# Patient Record
Sex: Male | Born: 1990 | Race: Black or African American | Hispanic: No | Marital: Single | Smoking: Current every day smoker
Health system: Southern US, Community
[De-identification: ages and names within clinical notes are randomized; demographics above are authoritative.]

## PROBLEM LIST (undated history)

## (undated) DIAGNOSIS — J189 Pneumonia, unspecified organism: Secondary | ICD-10-CM

## (undated) HISTORY — PX: NO PAST SURGERIES: SHX2092

---
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
## 2009-06-18 ENCOUNTER — Emergency Department (HOSPITAL_COMMUNITY): Admission: EM | Admit: 2009-06-18 | Discharge: 2009-06-18 | Payer: Self-pay | Admitting: Emergency Medicine

## 2011-06-13 ENCOUNTER — Emergency Department (HOSPITAL_COMMUNITY): Payer: Self-pay

## 2011-06-13 ENCOUNTER — Encounter: Payer: Self-pay | Admitting: Emergency Medicine

## 2011-06-13 ENCOUNTER — Emergency Department (HOSPITAL_COMMUNITY)
Admission: EM | Admit: 2011-06-13 | Discharge: 2011-06-13 | Disposition: A | Discharge status: Home / Self Care | Payer: Self-pay | Attending: Emergency Medicine | Admitting: Emergency Medicine

## 2011-06-13 DIAGNOSIS — M25569 Pain in unspecified knee: Secondary | ICD-10-CM | POA: Insufficient documentation

## 2011-06-13 DIAGNOSIS — W219XXA Striking against or struck by unspecified sports equipment, initial encounter: Secondary | ICD-10-CM | POA: Insufficient documentation

## 2011-06-13 DIAGNOSIS — Y9361 Activity, american tackle football: Secondary | ICD-10-CM | POA: Insufficient documentation

## 2011-06-13 DIAGNOSIS — M25462 Effusion, left knee: Secondary | ICD-10-CM

## 2011-06-13 DIAGNOSIS — S8390XA Sprain of unspecified site of unspecified knee, initial encounter: Secondary | ICD-10-CM

## 2011-06-13 DIAGNOSIS — M79609 Pain in unspecified limb: Secondary | ICD-10-CM | POA: Insufficient documentation

## 2011-06-13 DIAGNOSIS — IMO0002 Reserved for concepts with insufficient information to code with codable children: Secondary | ICD-10-CM | POA: Insufficient documentation

## 2011-06-13 DIAGNOSIS — M25469 Effusion, unspecified knee: Secondary | ICD-10-CM | POA: Insufficient documentation

## 2011-06-13 MED ORDER — IBUPROFEN 800 MG PO TABS: 800.0000 mg | ORAL_TABLET | Freq: Once | ORAL | Status: DC

## 2011-06-13 MED ORDER — HYDROCODONE-ACETAMINOPHEN 5-325 MG PO TABS: 2.0000 | ORAL_TABLET | Freq: Four times a day (QID) | ORAL | Status: AC | PRN

## 2011-06-13 MED ORDER — HYDROCODONE-ACETAMINOPHEN 5-325 MG PO TABS
2.0000 | ORAL_TABLET | Freq: Once | ORAL | Status: AC
  Administered 2011-06-13: 2 via ORAL
  Filled 2011-06-13: qty 2

## 2011-06-13 MED ORDER — IBUPROFEN 600 MG PO TABS: 600.0000 mg | ORAL_TABLET | Freq: Three times a day (TID) | ORAL | Status: AC

## 2011-06-13 MED ORDER — IBUPROFEN 200 MG PO TABS
400.0000 mg | ORAL_TABLET | Freq: Once | ORAL | Status: AC
  Administered 2011-06-13: 400 mg via ORAL
  Filled 2011-06-13: qty 2

## 2011-06-13 NOTE — ED Provider Notes (Signed)
History     CSN: [id number]  Arrival date & time 06/13/11  1415   First MD Initiated Contact with Patient 06/13/11 1749      Chief Complaint  Patient presents with  . Leg Pain    (Consider location/radiation/quality/duration/timing/severity/associated sxs/prior treatment) HPI Comments: Patient reports that he was playing football with friends yesterday and he was tackled and someone's head and shoulders ran into the outside of his left knee bending it medially and at the ankle. Patient reports it has been very painful to bend his knee or bear weight. He did take some extra strength Tylenol yesterday with no improvement. He is not taking any anti-inflammatories and he did not put any ice on it. He denies any prior significant orthopedic injuries. Is not currently working or on his feet much. He denies any other injuries. No lacerations or abrasions noted. He denies numbness or weakness.  Patient is a 21 y.o. male presenting with leg pain. The history is provided by the patient.  Leg Pain  Pertinent negatives include no numbness.    History reviewed. No pertinent past medical history.  History reviewed. No pertinent past surgical history.  History reviewed. No pertinent family history.  History  Substance Use Topics  . Smoking status: Current Everyday Smoker  . Smokeless tobacco: Not on file  . Alcohol Use: Yes      Review of Systems  Constitutional: Negative.   HENT: Negative for neck pain and neck stiffness.   Musculoskeletal: Positive for joint swelling and arthralgias. Negative for back pain.  Skin: Negative for color change, rash and wound.  Neurological: Negative for weakness and numbness.    Allergies  Review of patient's allergies indicates no known allergies.  Home Medications   Current Outpatient Rx  Name Route Sig Dispense Refill  . HYDROCODONE-ACETAMINOPHEN 5-325 MG PO TABS Oral Take 2 tablets by mouth every 6 (six) hours as needed for pain. 24 tablet 0    . IBUPROFEN 600 MG PO TABS Oral Take 1 tablet (600 mg total) by mouth 3 (three) times daily after meals. 21 tablet 0    BP 125/78  Pulse 89  Temp(Src) 98.2 F (36.8 C) (Oral)  Resp 18  SpO2 99%  Physical Exam  Nursing note and vitals reviewed. Constitutional: He is oriented to person, place, and time. He appears well-developed and well-nourished.  Cardiovascular: Intact distal pulses.   Musculoskeletal:       Legs: Neurological: He is alert and oriented to person, place, and time.  Skin: Skin is warm, dry and intact. No abrasion, no bruising, no ecchymosis, no laceration and no rash noted. No erythema.    ED Course  Procedures (including critical care time)  Labs Reviewed - No data to display Dg Knee Complete 4 Views Left  06/13/2011  *RADIOLOGY REPORT*  Clinical Data: Twisting injury with pain and swelling.  LEFT KNEE - COMPLETE 4+ VIEW  Comparison: None.  Findings: Four views of the left knee were obtained.  Normal alignment without acute fracture or dislocation. There is concern for a suprapatellar joint effusion.  No evidence for degenerative changes.  IMPRESSION: No acute bony abnormality to the left knee.  Concern for a suprapatellar joint effusion.  Original Report Authenticated By: Richarda Overlie, M.D.   I reviewed the above films myself.  1. Knee sprain   2. Effusion of left knee joint       MDM  Likely knee ligament strain.  Will get plain film to r/o fracture, assess for any  sig effusion which I don't suspect initially.  RICE instructions explained.          Darryl Rice. Darryl Lamas, MD 06/13/11 [id number]

## 2011-06-13 NOTE — ED Notes (Signed)
Pt c/o left leg pain after injury while playing football yesterday

## 2011-06-13 NOTE — Progress Notes (Signed)
Orthopedic Tech Progress Note Patient Details:  Darryl Rice 04/13/1991 [id number]  Other Ortho Devices Type of Ortho Device: Crutches;Knee Immobilizer Ortho Device Location: left leg   [name] 06/13/2011, 7:39 PM

## 2011-06-13 NOTE — Discharge Instructions (Signed)
 Knee Effusion The medical term for having fluid in your knee is effusion. This is often due to an internal derangement of the knee. This means something is wrong inside the knee. Some of the causes of fluid in the knee may be torn cartilage, a torn ligament, or bleeding into the joint from an injury. Your knee is likely more difficult to bend and move. This is often because there is increased pain and pressure in the joint. The time it takes for recovery from a knee effusion depends on different factors, including:   Type of injury.   Your age.   Physical and medical conditions.   Rehabilitation Strategies.  How long you will be away from your normal activities will depend on what kind of knee problem you have and how much damage is present. Your knee has two types of cartilage. Articular cartilage covers the bone ends and lets your knee bend and move smoothly. Two menisci, thick pads of cartilage that form a rim inside the joint, help absorb shock and stabilize your knee. Ligaments bind the bones together and support your knee joint. Muscles move the joint, help support your knee, and take stress off the joint itself. CAUSES  Often an effusion in the knee is caused by an injury to one of the menisci. This is often a tear in the cartilage. Recovery after a meniscus injury depends on how much meniscus is damaged and whether you have damaged other knee tissue. Small tears may heal on their own with conservative treatment. Conservative means rest, limited weight bearing activity and muscle strengthening exercises. Your recovery may take up to 6 weeks.  TREATMENT  Larger tears may require surgery. Meniscus injuries may be treated during arthroscopy. Arthroscopy is a procedure in which your surgeon uses a small telescope like instrument to look in your knee. Your caregiver can make a more accurate diagnosis (learning what is wrong) by performing an arthroscopic procedure. If your injury is on the inner  margin of the meniscus, your surgeon may trim the meniscus back to a smooth rim. In other cases your surgeon will try to repair a damaged meniscus with stitches (sutures). This may make rehabilitation take longer, but may provide better long term result by helping your knee keep its shock absorption capabilities. Ligaments which are completely torn usually require surgery for repair. HOME CARE INSTRUCTIONS  Use crutches as instructed.   If a brace is applied, use as directed.   Once you are home, an ice pack applied to your swollen knee may help with discomfort and help decrease swelling.   Keep your knee raised (elevated) when you are not up and around or on crutches.   Only take over-the-counter or prescription medicines for pain, discomfort, or fever as directed by your caregiver.   Your caregivers will help with instructions for rehabilitation of your knee. This often includes strengthening exercises.   You may resume a normal diet and activities as directed.  SEEK MEDICAL CARE IF:   There is increased swelling in your knee.   You notice redness, swelling, or increasing pain in your knee.   An unexplained oral temperature above 102 F (38.9 C) develops.  SEEK IMMEDIATE MEDICAL CARE IF:   You develop a rash.   You have difficulty breathing.   You have any allergic reactions from medications you may have been given.   There is severe pain with any motion of the knee.  MAKE SURE YOU:   Understand these instructions.  Will watch your condition.   Will get help right away if you are not doing well or get worse.  Document Released: 08/13/2003 Document Revised: 02/02/2011 Document Reviewed: 10/17/2007 [facility] Patient Information 2012 [place], [place].    Please call and make an appointment with orthopedic clinic for next week.  Let them know that you are being referred from the emergency department for a knee injury.  Try to keep your knee elevated, use ice packs for 20  minutes at a time, 3 times per day for the next 3 days.  Take medications as prescribed.    Narcotic and benzodiazepine use may cause drowsiness, slowed breathing or dependence.  Please use with caution and do not drive, operate machinery or watch young children alone while taking them.  Taking combinations of these medications or drinking alcohol will potentiate these effects.

## 2011-10-05 ENCOUNTER — Emergency Department (HOSPITAL_COMMUNITY)
Admission: EM | Admit: 2011-10-05 | Discharge: 2011-10-05 | Disposition: A | Discharge status: Home / Self Care | Payer: Self-pay | Attending: Emergency Medicine | Admitting: Emergency Medicine

## 2011-10-05 ENCOUNTER — Encounter (HOSPITAL_COMMUNITY): Payer: Self-pay | Admitting: *Deleted

## 2011-10-05 DIAGNOSIS — S8392XA Sprain of unspecified site of left knee, initial encounter: Secondary | ICD-10-CM

## 2011-10-05 DIAGNOSIS — IMO0002 Reserved for concepts with insufficient information to code with codable children: Secondary | ICD-10-CM | POA: Insufficient documentation

## 2011-10-05 DIAGNOSIS — Y9364 Activity, baseball: Secondary | ICD-10-CM | POA: Insufficient documentation

## 2011-10-05 DIAGNOSIS — X58XXXA Exposure to other specified factors, initial encounter: Secondary | ICD-10-CM | POA: Insufficient documentation

## 2011-10-05 MED ORDER — NAPROXEN 500 MG PO TABS: 500.0000 mg | ORAL_TABLET | Freq: Two times a day (BID) | ORAL | Status: DC

## 2011-10-05 MED ORDER — NAPROXEN 500 MG PO TABS
500.0000 mg | ORAL_TABLET | Freq: Once | ORAL | Status: AC
  Administered 2011-10-05: 500 mg via ORAL
  Filled 2011-10-05: qty 1

## 2011-10-05 NOTE — ED Notes (Signed)
Called with no answer x1 for str. Triage 5

## 2011-10-05 NOTE — Discharge Instructions (Signed)
Knee Pain The knee is the complex joint between your thigh and your lower leg. It is made up of bones, tendons, ligaments, and cartilage. The bones that make up the knee are:  The femur in the thigh.   The tibia and fibula in the lower leg.   The patella or kneecap riding in the groove on the lower femur.  CAUSES  Knee pain is a common complaint with many causes. A few of these causes are:  Injury, such as:   A ruptured ligament or tendon injury.   Torn cartilage.   Medical conditions, such as:   Gout   Arthritis   Infections   Overuse, over training or overdoing a physical activity.  Knee pain can be minor or severe. Knee pain can accompany debilitating injury. Minor knee problems often respond well to self-care measures or get well on their own. More serious injuries may need medical intervention or even surgery. SYMPTOMS The knee is complex. Symptoms of knee problems can vary widely. Some of the problems are:  Pain with movement and weight bearing.   Swelling and tenderness.   Buckling of the knee.   Inability to straighten or extend your knee.   Your knee locks and you cannot straighten it.   Warmth and redness with pain and fever.   Deformity or dislocation of the kneecap.  DIAGNOSIS  Determining what is wrong may be very straight forward such as when there is an injury. It can also be challenging because of the complexity of the knee. Tests to make a diagnosis may include:  Your caregiver taking a history and doing a physical exam.   Routine X-rays can be used to rule out other problems. X-rays will not reveal a cartilage tear. Some injuries of the knee can be diagnosed by:   Arthroscopy a surgical technique by which a small video camera is inserted through tiny incisions on the sides of the knee. This procedure is used to examine and repair internal knee joint problems. Tiny instruments can be used during arthroscopy to repair the torn knee cartilage  (meniscus).   Arthrography is a radiology technique. A contrast liquid is directly injected into the knee joint. Internal structures of the knee joint then become visible on X-ray film.   An MRI scan is a non x-ray radiology procedure in which magnetic fields and a computer produce two- or three-dimensional images of the inside of the knee. Cartilage tears are often visible using an MRI scanner. MRI scans have largely replaced arthrography in diagnosing cartilage tears of the knee.   Blood work.   Examination of the fluid that helps to lubricate the knee joint (synovial fluid). This is done by taking a sample out using a needle and a syringe.  TREATMENT The treatment of knee problems depends on the cause. Some of these treatments are:  Depending on the injury, proper casting, splinting, surgery or physical therapy care will be needed.   Give yourself adequate recovery time. Do not overuse your joints. If you begin to get sore during workout routines, back off. Slow down or do fewer repetitions.   For repetitive activities such as cycling or running, maintain your strength and nutrition.   Alternate muscle groups. For example if you are a weight lifter, work the upper body on one day and the lower body the next.   Either tight or weak muscles do not give the proper support for your knee. Tight or weak muscles do not absorb the stress placed   on the knee joint. Keep the muscles surrounding the knee strong.   Take care of mechanical problems.   If you have flat feet, orthotics or special shoes may help. See your caregiver if you need help.   Arch supports, sometimes with wedges on the inner or outer aspect of the heel, can help. These can shift pressure away from the side of the knee most bothered by osteoarthritis.   A brace called an "unloader" brace also may be used to help ease the pressure on the most arthritic side of the knee.   If your caregiver has prescribed crutches, braces,  wraps or ice, use as directed. The acronym for this is PRICE. This means protection, rest, ice, compression and elevation.   Nonsteroidal anti-inflammatory drugs (NSAID's), can help relieve pain. But if taken immediately after an injury, they may actually increase swelling. Take NSAID's with food in your stomach. Stop them if you develop stomach problems. Do not take these if you have a history of ulcers, stomach pain or bleeding from the bowel. Do not take without your caregiver's approval if you have problems with fluid retention, heart failure, or kidney problems.   For ongoing knee problems, physical therapy may be helpful.   Glucosamine and chondroitin are over-the-counter dietary supplements. Both may help relieve the pain of osteoarthritis in the knee. These medicines are different from the usual anti-inflammatory drugs. Glucosamine may decrease the rate of cartilage destruction.   Injections of a corticosteroid drug into your knee joint may help reduce the symptoms of an arthritis flare-up. They may provide pain relief that lasts a few months. You may have to wait a few months between injections. The injections do have a small increased risk of infection, water retention and elevated blood sugar levels.   Hyaluronic acid injected into damaged joints may ease pain and provide lubrication. These injections may work by reducing inflammation. A series of shots may give relief for as long as 6 months.   Topical painkillers. Applying certain ointments to your skin may help relieve the pain and stiffness of osteoarthritis. Ask your pharmacist for suggestions. Many over the-counter products are approved for temporary relief of arthritis pain.   In some countries, doctors often prescribe topical NSAID's for relief of chronic conditions such as arthritis and tendinitis. A review of treatment with NSAID creams found that they worked as well as oral medications but without the serious side effects.    PREVENTION  Maintain a healthy weight. Extra pounds put more strain on your joints.   Get strong, stay limber. Weak muscles are a common cause of knee injuries. Stretching is important. Include flexibility exercises in your workouts.   Be smart about exercise. If you have osteoarthritis, chronic knee pain or recurring injuries, you may need to change the way you exercise. This does not mean you have to stop being active. If your knees ache after jogging or playing basketball, consider switching to swimming, water aerobics or other low-impact activities, at least for a few days a week. Sometimes limiting high-impact activities will provide relief.   Make sure your shoes fit well. Choose footwear that is right for your sport.   Protect your knees. Use the proper gear for knee-sensitive activities. Use kneepads when playing volleyball or laying carpet. Buckle your seat belt every time you drive. Most shattered kneecaps occur in car accidents.   Rest when you are tired.  SEEK MEDICAL CARE IF:  You have knee pain that is continual and does not   seem to be getting better.  SEEK IMMEDIATE MEDICAL CARE IF:  Your knee joint feels hot to the touch and you have a high fever. MAKE SURE YOU:   Understand these instructions.   Will watch your condition.   Will get help right away if you are not doing well or get worse.  Document Released: 03/20/2007 Document Revised: 05/12/2011 Document Reviewed: 03/20/2007 [employer] Patient Information 2012 [employer]. 

## 2011-10-05 NOTE — ED Notes (Signed)
Pt on phone upon entering room.  Pt's friend states he needs something for pain, continues by stating the pt needs "2 percocet or vicodin".

## 2011-10-05 NOTE — ED Notes (Signed)
Pt in bathroom when RN came to do discharge.

## 2011-10-05 NOTE — ED Provider Notes (Signed)
History   This chart was scribed for Cyndra Numbers, MD by Melba Coon. The patient was seen in room STRE8/STRE8 and the patient's care was started at 12:27PM.    CSN: [id number]  Arrival date & time 10/05/11  1131   First MD Initiated Contact with Patient 10/05/11 1223      Chief Complaint  Patient presents with  . Knee Pain    (Consider location/radiation/quality/duration/timing/severity/associated sxs/prior treatment) HPI Darryl Rice is a 21 y.o. male who presents to the Emergency Department complaining of constant, moderate to severe left knee pain with an onset last night. Pt sprained his left knee 3 months playing football ago; never f/u w/ anyone. Pt re-aggravated his old injury last night; doesn't know mechanism of re-injury. Pt took tylenol last night which slightly alleviated the pain. No HA, fever, neck pain, sore throat, rash, back pain, CP, SOB, abd pain, n/v/d, dysuria, or extremity edema, weakness, numbness, or tingling. No known allergies. No other pertinent medical symptoms.  History reviewed. No pertinent past medical history.  History reviewed. No pertinent past surgical history.  No family history on file.  History  Substance Use Topics  . Smoking status: Current Everyday Smoker  . Smokeless tobacco: Not on file  . Alcohol Use: Yes      Review of Systems 10 Systems reviewed and all are negative for acute change except as noted in the HPI.   Allergies  Review of patient's allergies indicates no known allergies.  Home Medications  No current outpatient prescriptions on file.  BP 143/66  Pulse 86  Temp(Src) 98 F (36.7 C) (Oral)  SpO2 99%  Physical Exam  Nursing note and vitals reviewed. Constitutional: He is oriented to person, place, and time. He appears well-developed and well-nourished. No distress.  HENT:  Head: Normocephalic and atraumatic.  Eyes: EOM are normal.  Neck: Normal range of motion. Neck supple. No tracheal deviation  present.  Cardiovascular: Normal rate.   No murmur heard. Pulmonary/Chest: Effort normal. No respiratory distress.  Abdominal: Soft. There is no tenderness.  Musculoskeletal: Normal range of motion. He exhibits tenderness (left knee).  Neurological: He is alert and oriented to person, place, and time.  Skin: Skin is warm and dry.  Psychiatric: He has a normal mood and affect. His behavior is normal.    ED Course  Procedures (including critical care time)  DIAGNOSTIC STUDIES: Oxygen Saturation is 99% on room air, normal by my interpretation.    COORDINATION OF CARE:  12:31PM - EDMD recommends rest, ice, and antiinflammatory medicine; f/u with sports med dr referral. Pt states that he needs a MD note give to his lawyer for missing court today.   Labs Reviewed - No data to display No results found.   1. Left knee sprain       MDM  Patient on exam no imaging was needed today. Patient had no joint instability. He was given a dose of naproxen here and will be discharged with a prescription for this. Patient can followup with sports medicine physician as needed. Referral information provided. Patient discharged in good condition.  I personally performed the services described in this documentation, which was scribed in my presence. The recorded information has been reviewed and considered.          Cyndra Numbers, MD 10/05/11 1446

## 2011-10-05 NOTE — ED Notes (Signed)
Phone call to pharmacy made by Tresa Endo, charge RN, for pain medication

## 2011-10-05 NOTE — ED Notes (Signed)
Pt sprained his left knee in January.  Pt re-injured his left knee last night.  Pt is ambulatory with pain

## 2011-10-05 NOTE — ED Notes (Signed)
Pt returned (ambulatory) from outside, had been out to smoke

## 2012-02-03 ENCOUNTER — Emergency Department (HOSPITAL_COMMUNITY): Payer: Self-pay

## 2012-02-03 ENCOUNTER — Emergency Department (HOSPITAL_COMMUNITY)
Admission: EM | Admit: 2012-02-03 | Discharge: 2012-02-03 | Disposition: A | Discharge status: Home / Self Care | Payer: Self-pay | Attending: Emergency Medicine | Admitting: Emergency Medicine

## 2012-02-03 ENCOUNTER — Encounter (HOSPITAL_COMMUNITY): Payer: Self-pay | Admitting: *Deleted

## 2012-02-03 DIAGNOSIS — Y998 Other external cause status: Secondary | ICD-10-CM | POA: Insufficient documentation

## 2012-02-03 DIAGNOSIS — IMO0002 Reserved for concepts with insufficient information to code with codable children: Secondary | ICD-10-CM | POA: Insufficient documentation

## 2012-02-03 DIAGNOSIS — S8390XA Sprain of unspecified site of unspecified knee, initial encounter: Secondary | ICD-10-CM

## 2012-02-03 DIAGNOSIS — Y9367 Activity, basketball: Secondary | ICD-10-CM | POA: Insufficient documentation

## 2012-02-03 DIAGNOSIS — F17200 Nicotine dependence, unspecified, uncomplicated: Secondary | ICD-10-CM | POA: Insufficient documentation

## 2012-02-03 DIAGNOSIS — W1801XA Striking against sports equipment with subsequent fall, initial encounter: Secondary | ICD-10-CM | POA: Insufficient documentation

## 2012-02-03 DIAGNOSIS — M25462 Effusion, left knee: Secondary | ICD-10-CM

## 2012-02-03 MED ORDER — NAPROXEN 500 MG PO TABS: 500.0000 mg | ORAL_TABLET | Freq: Two times a day (BID) | ORAL | Status: AC

## 2012-02-03 MED ORDER — OXYCODONE-ACETAMINOPHEN 5-325 MG PO TABS
2.0000 | ORAL_TABLET | Freq: Once | ORAL | Status: AC
  Administered 2012-02-03: 2 via ORAL
  Filled 2012-02-03: qty 2

## 2012-02-03 MED ORDER — HYDROCODONE-ACETAMINOPHEN 5-325 MG PO TABS: 2.0000 | ORAL_TABLET | ORAL | Status: AC | PRN

## 2012-02-03 NOTE — ED Notes (Signed)
Pt reports playing basketball last night, reports his L knee started hurting after he had landed on it.  Pt reports previous hx of L knee sprain in the past.  Pt also reports pain from his L foot all the way up to his thigh.  Mild swelling noted.

## 2012-02-03 NOTE — ED Provider Notes (Signed)
Medical screening examination/treatment/procedure(s) were performed by non-physician practitioner and as supervising physician I was immediately available for consultation/collaboration.   Loren Racer, MD 02/03/12 [phone]

## 2012-02-03 NOTE — ED Provider Notes (Signed)
History     CSN: [id number]  Arrival date & time 02/03/12  1052   First MD Initiated Contact with Patient 02/03/12 1112      Chief Complaint  Patient presents with  . Knee Pain    (Consider location/radiation/quality/duration/timing/severity/associated sxs/prior treatment) The history is provided by the patient and medical records.   Darryl Rice is a 21 y.o. male presents to the emergency department complaining of L knee pain.  The onset of the symptoms was  abrupt starting 1 day ago.  The patient has associated swelling.  The symptoms have been  persistent, stabilized.  Walking, movement makes the symptoms worse and nothing makes symptoms better.  The patient denies fever, chills, neck pain, back pain.  Pt took tylenol extra strength without relief.    Pt states it hyperextended backwards.  Pt able to weight bear with difficulty.  Patient states he was playing basketball last night when he landed awkwardly on the knee and fell.  He denies hitting his head, neck pain or back pain.  He states he sprained his left knee 6 months ago and was evaluated here Darryl Rice long given a knee immobilizer and sent home. He did not followup with orthopedics at that time.  Since states pain with weightbearing and decreased range of motion.  He states the pain radiates to his foot and up to his thigh.     History reviewed. No pertinent past medical history.  History reviewed. No pertinent past surgical history.  No family history on file.  History  Substance Use Topics  . Smoking status: Current Everyday Smoker  . Smokeless tobacco: Not on file  . Alcohol Use: Yes      Review of Systems  Constitutional: Negative for fever.  HENT: Negative for neck pain and neck stiffness.   Musculoskeletal: Positive for joint swelling (left knee), arthralgias (left knee) and gait problem (secondary to pain). Negative for back pain.  Skin: Negative for wound.  Neurological: Negative for weakness, numbness  and headaches.    Allergies  Review of patient's allergies indicates no known allergies.  Home Medications   Current Outpatient Rx  Name Route Sig Dispense Refill  . ACETAMINOPHEN 500 MG PO TABS Oral Take 1,000 mg by mouth every 6 (six) hours as needed. Pain    . HYDROCODONE-ACETAMINOPHEN 5-325 MG PO TABS Oral Take 2 tablets by mouth every 4 (four) hours as needed for pain. 6 tablet 0  . NAPROXEN 500 MG PO TABS Oral Take 1 tablet (500 mg total) by mouth 2 (two) times daily with a meal. 30 tablet 0    BP 132/66  Pulse 47  Temp 97.7 F (36.5 C) (Oral)  Resp 20  SpO2 100%  Physical Exam  Nursing note and vitals reviewed. Constitutional: He appears well-developed and well-nourished. No distress.  HENT:  Head: Normocephalic and atraumatic.  Eyes: Conjunctivae are normal.  Neck: Normal range of motion. Neck supple.       Full range of motion without pain No spinal processes or paraspinal muscle tenderness  Cardiovascular: Normal rate, regular rhythm, normal heart sounds and intact distal pulses.  Exam reveals no gallop and no friction rub.   No murmur heard.      Capillary refill <3 sec  Pulmonary/Chest: Effort normal and breath sounds normal. No respiratory distress. He has no wheezes.  Musculoskeletal: He exhibits tenderness (left knee; medial and lateral joint line tenderness). He exhibits no edema.       ROM: Decreased active range of  motion. Full passive range of motion. No spinal processes or paraspinal muscle tenderness  Neurological: He is alert. Coordination normal.       Sensation to sharp and normal to touch. Strength normal skin toes, ankle, knee, hip  Skin: Skin is warm and dry. No rash noted. He is not diaphoretic. No erythema.    ED Course  Procedures (including critical care time)  Labs Reviewed - No data to display Dg Knee Complete 4 Views Left  02/03/2012  *RADIOLOGY REPORT*  Clinical Data: Knee injury.  Knee pain and swelling.  LEFT KNEE - COMPLETE 4+  VIEW  Comparison: 06/13/2011  Findings: A large knee joint effusion is seen.  No evidence of fracture or dislocation.  No other bone abnormality identified.  No evidence of knee joint arthropathy.  IMPRESSION: Large knee joint effusion.  No osseous abnormality.   Original Report Authenticated By: Danae Orleans, M.D.      1. Knee sprain   2. Knee effusion, left       MDM  Darryl Rice presents with left knee pain.  Concern for reinjury of left knee, likely sprain. X-ray with large knee joint effusion no osseous abnormality.  I have discussed the concern for soft tissue injury with the patient. I have strongly suggested orthopedic followup do to reinjury.  I will place a knee immobilizer and on crutches with naprosyn and pain control. I have also discussed reasons to return immediately to the ER.  Patient expresses understanding and agrees with plan.  1. Medications: Norco, naprosyn 2. Treatment: Rest, ice, compression, elevation 3. Follow Up: With orthopedics as soon as possible for further evaluation and treatment of the knee and effusion.         Darryl Client Alecia Doi, PA-C 02/03/12 1350

## 2012-02-03 NOTE — ED Notes (Signed)
Pt's family was here for transport

## 2013-07-22 ENCOUNTER — Emergency Department (HOSPITAL_COMMUNITY)
Admission: EM | Admit: 2013-07-22 | Discharge: 2013-07-22 | Disposition: A | Discharge status: Home / Self Care | Payer: Self-pay | Attending: Emergency Medicine | Admitting: Emergency Medicine

## 2013-07-22 ENCOUNTER — Emergency Department (HOSPITAL_COMMUNITY): Payer: Self-pay

## 2013-07-22 ENCOUNTER — Encounter (HOSPITAL_COMMUNITY): Payer: Self-pay | Admitting: Emergency Medicine

## 2013-07-22 DIAGNOSIS — S31809A Unspecified open wound of unspecified buttock, initial encounter: Secondary | ICD-10-CM | POA: Insufficient documentation

## 2013-07-22 DIAGNOSIS — L0390 Cellulitis, unspecified: Secondary | ICD-10-CM

## 2013-07-22 DIAGNOSIS — Y939 Activity, unspecified: Secondary | ICD-10-CM | POA: Insufficient documentation

## 2013-07-22 DIAGNOSIS — Z23 Encounter for immunization: Secondary | ICD-10-CM | POA: Insufficient documentation

## 2013-07-22 DIAGNOSIS — L0291 Cutaneous abscess, unspecified: Secondary | ICD-10-CM | POA: Insufficient documentation

## 2013-07-22 DIAGNOSIS — S31821A Laceration without foreign body of left buttock, initial encounter: Secondary | ICD-10-CM

## 2013-07-22 DIAGNOSIS — Y929 Unspecified place or not applicable: Secondary | ICD-10-CM | POA: Insufficient documentation

## 2013-07-22 DIAGNOSIS — F17200 Nicotine dependence, unspecified, uncomplicated: Secondary | ICD-10-CM | POA: Insufficient documentation

## 2013-07-22 MED ORDER — OXYCODONE-ACETAMINOPHEN 5-325 MG PO TABS
2.0000 | ORAL_TABLET | Freq: Once | ORAL | Status: AC
  Administered 2013-07-22: 2 via ORAL
  Filled 2013-07-22: qty 2

## 2013-07-22 MED ORDER — OXYCODONE-ACETAMINOPHEN 5-325 MG PO TABS: 1.0000 | ORAL_TABLET | Freq: Four times a day (QID) | ORAL | Status: DC | PRN

## 2013-07-22 MED ORDER — CEPHALEXIN 500 MG PO CAPS: 500.0000 mg | ORAL_CAPSULE | Freq: Four times a day (QID) | ORAL | Status: DC

## 2013-07-22 MED ORDER — TETANUS-DIPHTH-ACELL PERTUSSIS 5-2.5-18.5 LF-MCG/0.5 IM SUSP
0.5000 mL | Freq: Once | INTRAMUSCULAR | Status: AC
  Administered 2013-07-22: 0.5 mL via INTRAMUSCULAR
  Filled 2013-07-22: qty 0.5

## 2013-07-22 NOTE — ED Notes (Signed)
Pt brought back to room with visitor in tow; pt now getting undressed and into a gown at this time
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
## 2013-07-22 NOTE — Discharge Instructions (Signed)
Follow up with primary care doctor in 1 week to reevaluated wound. Resource guide provided below for follow up reference. Take medications as directed for infection and pain. Do not drive with pain medication. Return to ED should you develop worsening symptoms of fever/chills, leg weakness/numbness, worsening pain, or drainage from wound site.    Emergency Department Resource Guide 1) Find a Doctor and Pay Out of Pocket Although you won't have to find out who is covered by your insurance plan, it is a good idea to ask around and get recommendations. You will then need to call the office and see if the doctor you have chosen will accept you as a new patient and what types of options they offer for patients who are self-pay. Some doctors offer discounts or will set up payment plans for their patients who do not have insurance, but you will need to ask so you aren't surprised when you get to your appointment.  2) Contact Your Local Health Department Not all health departments have doctors that can see patients for sick visits, but many do, so it is worth a call to see if yours does. If you don't know where your local health department is, you can check in your phone book. The CDC also has a tool to help you locate your state's health department, and many state websites also have listings of all of their local health departments.  3) Find a Walk-in Clinic If your illness is not likely to be very severe or complicated, you may want to try a walk in clinic. These are popping up all over the country in pharmacies, drugstores, and shopping centers. They're usually staffed by nurse practitioners or physician assistants that have been trained to treat common illnesses and complaints. They're usually fairly quick and inexpensive. However, if you have serious medical issues or chronic medical problems, these are probably not your best option.  No Primary Care Doctor: - Call Health Connect at  [phone] - they can  help you locate a primary care doctor that  accepts your insurance, provides certain services, etc. - Physician Referral Service- [phone]  Chronic Pain Problems: Organization         Address  Phone   Notes  [facility]  [phone] Patients need to be referred by their primary care doctor.   Medication Assistance: Organization         Address  Phone   Notes  [facility] Medication [facility] [address] [place], [place] [address] [phone] --Must be a resident of [place] -- Must have NO insurance coverage whatsoever (no Medicaid/ Medicare, etc.) -- The pt. MUST have a primary care doctor that directs their care regularly and follows them in the community   Med[facility]ist  [phone]   [employer]  [phone]    Agencies that provide inexpensive medical care: Organization         Address  Phone   Notes  [facility]  [phone]   [facility]    [phone]   [facility] [address] [place], [place] [address] [phone]   [facility] [address], [place] [phone]) [phone]   [facility]anned [facility]    [phone]   [facility]    [phone]   [facility]  [address], [place] Phone:  [phone], Fax:  [phone] Hours  of Operation:  9 am - 6 pm, M-F.  Also accepts Medicaid/Medicare and self-pay.  [facility] for [place] [address], [address], [place] Phone: [phone], Fax: [phone]. Hours of Operation:  8:30 am - 5:30 pm, M-F.  Also accepts Medicaid and self-pay.  [facility] [place] [address], [place] Phone: [phone]   [place], [place], [place] [phone], Ext. [phone] Mondays & Thursdays: 7-9 AM.  First 15 patients are seen on a first come, first serve basis.    [place] Providers:  Organization         Address  Phone   Notes  [facility] [address], [place] [phone] Also accepts self-pay patients.  [facility] [address], [place]  [phone]   [place], [address], [place] [phone]   [facility] [address], [place] [phone]   Lucianne Lei [address], [place]   [phone] Only accepts [place] Access [place] patients after they have their name applied to their card.   Self-Pay (no insurance) in [facility]:  Organization         Address  Phone   Notes  Sickle Cell Patients, [facility] [place] [phone]   [facility] [place] [phone]   [facility] [place]  [place], [place], [place] [phone]   [facility]/Dr. Osei-Bonsu  [address], [place] or [address], [place] [phone] Phone number for both [place] and [place] locations is the same.  Urgent Medical and [facility] [address] [place] [phone]   [facility] [address] [place] or [address] [phone]   [facility] [address], [place] [phone], phone; [phone], fax Sees patients 1st and 3rd Saturday of every month.  Must not qualify for public or private insurance (i.e. Medicaid, Medicare, [place] Health Choice, Veterans' Benefits)  Household income should be no more than 200% of the poverty level The clinic cannot treat you if you are pregnant or think you are pregnant  Sexually transmitted diseases are not treated at the clinic.    Dental Care: Organization         Address  Phone  Notes  [facility] Department of [facility] [place] [phone] Accepts children up to age 63 who are enrolled in [place] or [place]; pregnant women with a Medicaid card; and children who have applied for Medicaid or [place] Health Choice, but were declined, whose parents can pay a reduced fee at time of service.  [facility] Department of [facility]  [address], [place] [phone] Accepts children up to age 5 who are enrolled in [place] or [place]; pregnant women with a Medicaid card; and children who have applied for Medicaid or [place] Health Choice, but were declined, whose parents can pay a reduced fee at time of service.  [place] [facility] Access PROGRAM  [place] [phone] Patients are seen by appointment only. Walk-ins are not accepted. [place] will see  patients 26 years of age and older. Monday - Tuesday (8am-5pm) Most Wednesdays (8:30-5pm) $30 per visit, cash only  [facility] Adult Dental Access PROGRAM  [address], [facility] [phone] Patients are seen by appointment only. Walk-ins are not accepted. [place] will see patients 42 years of age and older. One Wednesday Evening (Monthly: Volunteer Based).  $30 per visit, cash only  [place]  [phone] for adults; Children under age 60, call Graduate Pediatric Dentistry at [phone]. Children aged 26-14, please call [phone] to request a pediatric application.  Dental services are provided in all areas of dental care including fillings, crowns and bridges, complete and partial dentures, implants, gum treatment, root canals, and extractions. Preventive care is also provided. Treatment is provided to both adults and children. Patients are selected via a lottery and there is often a waiting list.   [facility] [address] [place]  [phone] [email or web address]   [facility] [address] [place], [place] [phone], Ext. [phone] Second and Fourth Thursday of each month, opens at 6:30 AM; Clinic ends at 9 AM.  Patients are seen on a first-come first-served basis, and a limited number are seen during each clinic.   [facility]  [address] Hillard Danker [place], [place] [phone]   Eligibility Requirements You must have lived in [place], [place], or [place] counties for at least the last three months.   You cannot be eligible for state or federal sponsored [employer], including [employer], [place], or [employer].   You generally cannot be eligible for healthcare insurance through your employer.    How to apply: Eligibility screenings are held every Tuesday and Wednesday afternoon from 1:00 pm until 4:00 pm. You do not need an appointment for the interview!  [facility] [address] [place], [place]   [place]  [facility] Department  [place]  [phone]    Behavioral Health Resources in the Community: Intensive Outpatient Programs Organization         Address  Phone  Notes  [place]. [address] [place], [place] [phone]   [facility] Outpatient [address], [place], [place]   ADS: [facility] [address] [place], [place]   [place] [address],  [place], [place] or [phone]   Substance Abuse Resources Organization         Address  Phone  Notes  Alcohol and Drug Services  [phone]   [place]  [phone]   [facility] [place]   Chinita Pester  [phone]   Residential & Outpatient Substance Abuse Program  [phone]   Psychological Services Organization         Address  Phone  Notes  [facility] [place]  [place]  [phone]   [place] [address] [place] or [phone]    Mobile Crisis Teams Organization         Address  Phone  Notes  [facility], Mobile Crisis Care Unit  [phone]   [facility]otherapeutic [facility]  [address] [place], [place]   [facility] [address] [place] [phone]    Self-Help/Support Groups Organization  Address  Phone             Notes  °[facility] [place] - variety of support groups  [phone]- [phone] Call for more information  °Narcotics Anonymous (NA), [facility] [address], °[place]  2 meetings at this location  ° °Residential Treatment Programs °Organization         Address  Phone  Notes  °[facility] [address],    °[place]  [phone]   °[facility] ° [address], [place], [place] [phone]   °[facility] [address], [place] [phone] Admissions: 8am-3pm M-F  °[facility] [address]    °[place], [place] [phone]   °[facility] [address], [place], [place] [phone]   °[facility] [address]  °[place], [place] [phone]   °[facility] - Intensive Outpatient [address], [place], [place] [phone]   °[facility] [address]  °[place], [place] 1-877-615-2722 or [phone]   °[facility] ([facility]) [address] [place], [place] [phone] Accepts Medicaid  °[facility] [address]  °[place] 1-800-659-3381 Substance Abuse/Addiction Treatment  ° °[place] Behavioral Health Resources °Organization         Address  Phone  Notes  °[facility]  (888) [phone]   °Julie Brannon, PhD [address] [place], [place]   (336) [phone] or  (336) [phone]   ° Behavioral   [address] °[place], [place] ([phone]) [phone]   °[facility] [address], [place], [place] ([phone]) [phone] Insurance/Medicaid/sponsorship through [facility]  °[facility] [address]                                    [place], [place] ([phone]) [phone] Therapy/tele-psych/case  °[facility] [address]  ° [place], [place] (336) [phone]    °Dr. Arfeen  (336) [phone]   °[facility] [place]  [employer] [place] [facility] 1) [address],  °2) [address], [place] °3)  [address], [place] ([phone]) [phone] °(336) [phone] ° °(336) [phone]   °[place] Child Abuse Hotline (336) 342-1394 or (336) [phone] (After Hours)    ° ° ° °
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
## 2014-02-01 ENCOUNTER — Encounter (HOSPITAL_COMMUNITY): Payer: Self-pay | Admitting: Emergency Medicine

## 2014-02-01 ENCOUNTER — Emergency Department (HOSPITAL_COMMUNITY)
Admission: EM | Admit: 2014-02-01 | Discharge: 2014-02-01 | Disposition: A | Discharge status: Home / Self Care | Payer: Self-pay | Attending: Emergency Medicine | Admitting: Emergency Medicine

## 2014-02-01 DIAGNOSIS — Z202 Contact with and (suspected) exposure to infections with a predominantly sexual mode of transmission: Secondary | ICD-10-CM

## 2014-02-01 LAB — RPR

## 2014-02-01 LAB — HIV ANTIBODY (ROUTINE TESTING W REFLEX): HIV: NONREACTIVE

## 2014-02-01 MED ORDER — AZITHROMYCIN 250 MG PO TABS
1000.0000 mg | ORAL_TABLET | Freq: Once | ORAL | Status: AC
  Administered 2014-02-01: 1000 mg via ORAL
  Filled 2014-02-01: qty 4

## 2014-02-01 MED ORDER — LIDOCAINE HCL (PF) 1 % IJ SOLN
5.0000 mL | Freq: Once | INTRAMUSCULAR | Status: AC
  Administered 2014-02-01: 5 mL
  Filled 2014-02-01: qty 5

## 2014-02-01 MED ORDER — CEFTRIAXONE SODIUM 250 MG IJ SOLR
250.0000 mg | Freq: Once | INTRAMUSCULAR | Status: AC
  Administered 2014-02-01: 250 mg via INTRAMUSCULAR
  Filled 2014-02-01: qty 250
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
## 2014-02-01 NOTE — ED Provider Notes (Signed)
  Chief Complaint   Chief Complaint  Patient presents with  . Exposure to STD    History of Present Illness   Darryl Rice is a 23 year old male who was informed by his sex partner that she tested positive for Chlamydia. He denies any urethral discharge, burning, or penile pain. He's had no lesions on the penis, blisters, ulcers, or rash. He denies any adenopathy or testicular pain or swelling. No abdominal pain, he has had some nausea and vomiting. He denies any fever, chills, skin rash, or joint pain. He's had a history of Trichomonas in the past. He states he has multiple sexual partners, too many to count. He uses condoms with all his partners with the exception of his regular partner, but she was the one who tested positive for Chlamydia.  Review of Systems   Other than as noted above, the patient denies any of the following symptoms: Systemic:  No fevers chills, arthralgias, or adenopathy. GI:  No abdominal pain, nausea or vomiting. GU:  No dysuria, penile pain, discharge, itching, dysuria, genital lesions, testicular pain or swelling. Skin:  No rash or itching.  PMFSH   Past medical history, family history, social history, meds, and allergies were reviewed.   Physical Examination    Vital signs:  BP 129/72  Pulse 66  Temp(Src) 98.4 F (36.9 C) (Oral)  Resp 12  Ht 6' (1.829 m)  Wt 275 lb (124.739 kg)  BMI 37.29 kg/m2  SpO2 100% Gen:  Alert, oriented, in no distress. Abdomen:  Soft and flat, non-distended, and non-tender.  No organomegaly or mass. Genital:  No urethral discharge, no penile lesions, no inguinal adenopathy. Testes are normal without any swelling, tenderness, or mass. Skin:  Warm and dry.  No rash.   Course in Urgent Care Center   Given Rocephin 250 mg IM and azithromycin 1000 mg by mouth. Serologies for HIV and syphilis were also obtained. DNA probes for gonorrhea, Chlamydia, Trichomonas were obtained.  Assessment   The encounter diagnosis was  STD exposure.  Plan    1.  Meds:  The following meds were prescribed:   Discharge Medication List as of 02/01/2014 10:52 AM      2.  Patient Education/Counseling:  The patient was given appropriate handouts, self care instructions, and instructed in symptomatic relief.The patient was instructed to inform all sexual contacts, avoid intercourse completely for 2 weeks and then only with a condom.  The patient was told that we would call about all abnormal lab results, and that we would need to report certain kinds of infection to the health department.    3.  Follow up:  The patient was told to follow up here if no better in 3 to 4 days, or sooner if becoming worse in any way, and given some red flag symptoms such as fever, pain, or difficulty urinating which would prompt immediate return.      Reuben Likes, MD 02/01/14 2149

## 2014-02-01 NOTE — Discharge Instructions (Signed)
You have been diagnosed with a possible STD.  Your results should be back in  1 - 3 days.  We will call you with the results of any positive tests, so if you don't hear from [place], you can assume your results are all negative.  If you wish, you can call us here and ask for a nurse to give you the results.  They can give you the results of all your tests over the phone.  If your HIV should come back positive, we must give you this result in person to protect your confidentiality.  We can give you a negative HIV result over the phone.    In the meantime, you should avoid intercourse altogether for 1 week.  After that, you should always use condoms--100% of the time.  This will not only prevent pregnancy, but has been shown to prevent HIV, syphilis, gonorrhea, chlamydia, hepatis C and other STDs.  If your test comes back positive, we are required by law to report it to the Health Department.  We also suggest you inform your partner or partners so they can get tested and treated as well.  You can get STD testing for free at the [facility] Department.  It is recommended that you have repeat testing for HIV and syphilis in 3 and 6 months, since it can take a while for these tests to become positive.

## 2014-02-08 ENCOUNTER — Telehealth (HOSPITAL_BASED_OUTPATIENT_CLINIC_OR_DEPARTMENT_OTHER): Payer: Self-pay | Admitting: Emergency Medicine

## 2014-02-08 NOTE — Telephone Encounter (Signed)
ID verified. Patient notified of positive chlamydia and that treatment was given in ED. STD instructions provided - patient verbalized understanding.

## 2014-06-02 ENCOUNTER — Emergency Department (HOSPITAL_COMMUNITY): Payer: Self-pay

## 2014-06-02 ENCOUNTER — Emergency Department (HOSPITAL_COMMUNITY)
Admission: EM | Admit: 2014-06-02 | Discharge: 2014-06-02 | Disposition: A | Discharge status: Home / Self Care | Payer: Self-pay | Attending: Emergency Medicine | Admitting: Emergency Medicine

## 2014-06-02 ENCOUNTER — Encounter (HOSPITAL_COMMUNITY): Payer: Self-pay | Admitting: Emergency Medicine

## 2014-06-02 DIAGNOSIS — R079 Chest pain, unspecified: Secondary | ICD-10-CM

## 2014-06-02 DIAGNOSIS — R7989 Other specified abnormal findings of blood chemistry: Secondary | ICD-10-CM | POA: Insufficient documentation

## 2014-06-02 DIAGNOSIS — J189 Pneumonia, unspecified organism: Secondary | ICD-10-CM

## 2014-06-02 DIAGNOSIS — Z720 Tobacco use: Secondary | ICD-10-CM | POA: Insufficient documentation

## 2014-06-02 DIAGNOSIS — J159 Unspecified bacterial pneumonia: Secondary | ICD-10-CM | POA: Insufficient documentation

## 2014-06-02 DIAGNOSIS — R1110 Vomiting, unspecified: Secondary | ICD-10-CM | POA: Insufficient documentation

## 2014-06-02 LAB — BASIC METABOLIC PANEL
Anion gap: 9 (ref 5–15)
BUN: 12 mg/dL (ref 6–23)
CALCIUM: 9.5 mg/dL (ref 8.4–10.5)
CO2: 25 mmol/L (ref 19–32)
Chloride: 101 mEq/L (ref 96–112)
Creatinine, Ser: 1.38 mg/dL — ABNORMAL HIGH (ref 0.50–1.35)
GFR, EST AFRICAN AMERICAN: 82 mL/min — AB (ref >90)
GFR, EST NON AFRICAN AMERICAN: 70 mL/min — AB (ref >90)
Glucose, Bld: 100 mg/dL — ABNORMAL HIGH (ref 70–99)
Potassium: 4 mmol/L (ref 3.5–5.1)
Sodium: 135 mmol/L (ref 135–145)

## 2014-06-02 LAB — CBC
HEMATOCRIT: 41.9 % (ref 39.0–52.0)
Hemoglobin: 13.5 g/dL (ref 13.0–17.0)
MCH: 24.9 pg — AB (ref 26.0–34.0)
MCHC: 32.2 g/dL (ref 30.0–36.0)
MCV: 77.3 fL — ABNORMAL LOW (ref 78.0–100.0)
Platelets: 169 10*3/uL (ref 150–400)
RBC: 5.42 MIL/uL (ref 4.22–5.81)
RDW: 13 % (ref 11.5–15.5)
WBC: 11.9 10*3/uL — ABNORMAL HIGH (ref 4.0–10.5)

## 2014-06-02 LAB — I-STAT TROPONIN, ED: Troponin i, poc: 0 ng/mL (ref 0.00–0.08)

## 2014-06-02 MED ORDER — PREDNISONE 20 MG PO TABS
60.0000 mg | ORAL_TABLET | Freq: Once | ORAL | Status: AC
  Administered 2014-06-02: 60 mg via ORAL
  Filled 2014-06-02: qty 3

## 2014-06-02 MED ORDER — ALBUTEROL SULFATE HFA 108 (90 BASE) MCG/ACT IN AERS
2.0000 | INHALATION_SPRAY | Freq: Once | RESPIRATORY_TRACT | Status: AC
  Administered 2014-06-02: 2 via RESPIRATORY_TRACT
  Filled 2014-06-02: qty 6.7

## 2014-06-02 MED ORDER — PREDNISONE 10 MG PO TABS: ORAL_TABLET | ORAL | Status: DC

## 2014-06-02 MED ORDER — AZITHROMYCIN 250 MG PO TABS
500.0000 mg | ORAL_TABLET | Freq: Once | ORAL | Status: AC
  Administered 2014-06-02: 500 mg via ORAL
  Filled 2014-06-02: qty 2

## 2014-06-02 MED ORDER — BENZONATATE 100 MG PO CAPS: 100.0000 mg | ORAL_CAPSULE | Freq: Three times a day (TID) | ORAL | Status: DC

## 2014-06-02 MED ORDER — AZITHROMYCIN 250 MG PO TABS: ORAL_TABLET | ORAL | Status: DC

## 2014-06-02 NOTE — Discharge Instructions (Signed)
Use inhaler, 2 puffs every 4 hrs. zithromax as prescribed until all gone, next dose tomorrow. Take prednisone as prescribed until gone, next dose tomorrow. Take Tessalon as prescribed as needed for cough. Return if worsening symptoms, shortness of breath, worsening pain or bleeding.     Pneumonia Pneumonia is an infection of the lungs.  CAUSES Pneumonia may be caused by bacteria or a virus. Usually, these infections are caused by breathing infectious particles into the lungs (respiratory tract). SIGNS AND SYMPTOMS   Cough.  Fever.  Chest pain.  Increased rate of breathing.  Wheezing.  Mucus production. DIAGNOSIS  If you have the common symptoms of pneumonia, your health care provider will typically confirm the diagnosis with a chest X-ray. The X-ray will show an abnormality in the lung (pulmonary infiltrate) if you have pneumonia. Other tests of your blood, urine, or sputum may be done to find the specific cause of your pneumonia. Your health care provider may also do tests (blood gases or pulse oximetry) to see how well your lungs are working. TREATMENT  Some forms of pneumonia may be spread to other people when you cough or sneeze. You may be asked to wear a mask before and during your exam. Pneumonia that is caused by bacteria is treated with antibiotic medicine. Pneumonia that is caused by the influenza virus may be treated with an antiviral medicine. Most other viral infections must run their course. These infections will not respond to antibiotics.  HOME CARE INSTRUCTIONS   Cough suppressants may be used if you are losing too much rest. However, coughing protects you by clearing your lungs. You should avoid using cough suppressants if you can.  Your health care provider may have prescribed medicine if he or she thinks your pneumonia is caused by bacteria or influenza. Finish your medicine even if you start to feel better.  Your health care provider may also prescribe an  expectorant. This loosens the mucus to be coughed up.  Take medicines only as directed by your health care provider.  Do not smoke. Smoking is a common cause of bronchitis and can contribute to pneumonia. If you are a smoker and continue to smoke, your cough may last several weeks after your pneumonia has cleared.  A cold steam vaporizer or humidifier in your room or home may help loosen mucus.  Coughing is often worse at night. Sleeping in a semi-upright position in a recliner or using a couple pillows under your head will help with this.  Get rest as you feel it is needed. Your body will usually let you know when you need to rest. PREVENTION A pneumococcal shot (vaccine) is available to prevent a common bacterial cause of pneumonia. This is usually suggested for:  People over 24 years old.  Patients on chemotherapy.  People with chronic lung problems, such as bronchitis or emphysema.  People with immune system problems. If you are over 65 or have a high risk condition, you may receive the pneumococcal vaccine if you have not received it before. In some countries, a routine influenza vaccine is also recommended. This vaccine can help prevent some cases of pneumonia.You may be offered the influenza vaccine as part of your care. If you smoke, it is time to quit. You may receive instructions on how to stop smoking. Your health care provider can provide medicines and counseling to help you quit. SEEK MEDICAL CARE IF: You have a fever. SEEK IMMEDIATE MEDICAL CARE IF:   Your illness becomes worse. This is  especially true if you are elderly or weakened from any other disease.  You cannot control your cough with suppressants and are losing sleep.  You begin coughing up blood.  You develop pain which is getting worse or is uncontrolled with medicines.  Any of the symptoms which initially brought you in for treatment are getting worse rather than better.  You develop shortness of breath  or chest pain. MAKE SURE YOU:   Understand these instructions.  Will watch your condition.  Will get help right away if you are not doing well or get worse. Document Released: 05/23/2005 Document Revised: 10/07/2013 Document Reviewed: 08/12/2010 [facility] Patient Information 2015 [place], [place]. This information is not intended to replace advice given to you by your health care provider. Make sure you discuss any questions you have with your health care provider.   Hemoptysis Hemoptysis, which means coughing up blood, can be a sign of a minor problem or a serious medical condition. The blood that is coughed up may come from the lungs and airways. Coughed-up blood can also come from bleeding that occurs outside the lungs and airways. Blood can drain into the windpipe during a severe nosebleed or when blood is vomited from the stomach. Because hemoptysis can be a sign of something serious, a medical evaluation is required. For some people with hemoptysis, no definite cause is ever identified. CAUSES  The most common cause of hemoptysis is bronchitis. Some other common causes include:   A ruptured blood vessel caused by coughing or an infection.   A medical condition that causes damage to the large air passageways (bronchiectasis).   A blood clot in the lungs (pulmonary embolism).   Pneumonia.   Tuberculosis.   Breathing in a small foreign object.   Cancer. For some people with hemoptysis, no definite cause is ever identified.  HOME CARE INSTRUCTIONS  Only take over-the-counter or prescription medicines as directed by your caregiver. Do not use cough suppressants unless your caregiver approves.  If your caregiver prescribes antibiotic medicines, take them as directed. Finish them even if you start to feel better.  Do not smoke. Also avoid secondhand smoke.  Follow up with your caregiver as directed. SEEK IMMEDIATE MEDICAL CARE IF:   You cough up bloody mucus for longer  than a week.  You have a blood-producing cough that is severe or getting worse.  You have a blood-producing cough thatcomes and goes over time.  You develop problems with your breathing.   You vomit blood.  You develop bloody or black-colored stools.  You have chest pain.   You develop night sweats.  You feel faint or pass out.   You have a fever or persistent symptoms for more than 2-3 days.  You have a fever and your symptoms suddenly get worse. MAKE SURE YOU:  Understand these instructions.  Will watch your condition.  Will get help right away if you are not doing well or get worse. Document Released: 08/01/2001 Document Revised: 05/09/2012 Document Reviewed: 03/09/2012 [facility] Patient Information 2015 [place], [place]. This information is not intended to replace advice given to you by your health care provider. Make sure you discuss any questions you have with your health care provider.

## 2014-06-02 NOTE — ED Notes (Signed)
Pt reports 2 days of cp and sore throat, with coughing up blood

## 2014-06-02 NOTE — ED Provider Notes (Signed)
CSN: [id number]     Arrival date & time 06/02/14  1134 History   First MD Initiated Contact with Patient 06/02/14 1527     Chief Complaint  Patient presents with  . Chest Pain     (Consider location/radiation/quality/duration/timing/severity/associated sxs/prior Treatment) HPI Darryl Rice is a 23 y.o. male  With no medical problems, presents to ED complaining of cough, congestion, chills, post tussive emesis, coughing up blood. Patient states that he has had cough and upper respiratory type symptoms for 2 days. This morning his cough worsened, he developed chills, states he threw up 3 times after coughing its time seeing small amount of pain blood. Patient denies shortness of breath. He denies any recent travel or surgeries. No leg swelling or pain. He has not tried any medications, states after he saw blood and it happened several times he came straight here. He denies any personal history of family history of blood clots. He is a smoker. No prior along with heart problems. He denies any fever, admits to chills. He denies any abdominal pain. She reports central chest pain with coughing.  History reviewed. No pertinent past medical history. History reviewed. No pertinent past surgical history. History reviewed. No pertinent family history. History  Substance Use Topics  . Smoking status: Current Every Day Smoker -- 0.50 packs/day    Types: Cigarettes  . Smokeless tobacco: Not on file  . Alcohol Use: Yes     Comment: occ    Review of Systems  Constitutional: Positive for chills. Negative for fever.  Respiratory: Positive for cough. Negative for chest tightness and shortness of breath.   Cardiovascular: Positive for chest pain. Negative for palpitations and leg swelling.  Gastrointestinal: Positive for vomiting. Negative for nausea, abdominal pain, diarrhea and abdominal distention.  Genitourinary: Negative for dysuria, urgency, frequency and hematuria.  Musculoskeletal: Negative  for myalgias, arthralgias, neck pain and neck stiffness.  Skin: Negative for rash.  Allergic/Immunologic: Negative for immunocompromised state.  Neurological: Negative for dizziness, weakness, light-headedness, numbness and headaches.  All other systems reviewed and are negative.     Allergies  Review of patient's allergies indicates no known allergies.  Home Medications   Prior to Admission medications   Not on File   BP 129/74 mmHg  Pulse 93  Temp(Src) 99.1 F (37.3 C) (Oral)  Resp 16  Ht 6\' 1"  (1.854 m)  Wt 293 lb (132.904 kg)  BMI 38.67 kg/m2  SpO2 98% Physical Exam  Constitutional: He is oriented to person, place, and time. He appears well-developed and well-nourished. No distress.  HENT:  Head: Normocephalic and atraumatic.  Eyes: Conjunctivae are normal.  Neck: Neck supple.  Cardiovascular: Normal rate, regular rhythm and normal heart sounds.   Pulmonary/Chest: Effort normal. No respiratory distress. He has no wheezes. He has no rales. He exhibits no tenderness.  Abdominal: Soft. Bowel sounds are normal. He exhibits no distension. There is no tenderness. There is no rebound.  Musculoskeletal: He exhibits no edema.  Neurological: He is alert and oriented to person, place, and time.  Skin: Skin is warm and dry.  Nursing note and vitals reviewed.   ED Course  Procedures (including critical care time) Labs Review Labs Reviewed  CBC - Abnormal; Notable for the following:    WBC 11.9 (*)    MCV 77.3 (*)    MCH 24.9 (*)    All other components within normal limits  BASIC METABOLIC PANEL - Abnormal; Notable for the following:    Glucose, Bld 100 (*)  Creatinine, Ser 1.38 (*)    GFR calc non Af Amer 70 (*)    GFR calc Af Amer 82 (*)    All other components within normal limits  I-STAT TROPOININ, ED    Imaging Review Dg Chest 2 View  06/02/2014   CLINICAL DATA:  Mid chest discomfort, cough, shortness of breath for 2 days  EXAM: CHEST  2 VIEW  COMPARISON:   None.  FINDINGS: There is right middle lobe hazy airspace disease. There is no pleural effusion or pneumothorax. There is no other focal parenchymal opacity. The heart and mediastinal contours are unremarkable.  The osseous structures are unremarkable.  IMPRESSION: Hazy right middle lobe airspace disease concerning for pneumonia.   Electronically Signed   By: Elige KoHetal  Patel   On: 06/02/2014 13:52     EKG Interpretation None      MDM   Final diagnoses:  CAP (community acquired pneumonia)  Elevated serum creatinine    Patient emergency department with cough for 2 days, worsening today, chills, blood in his sputum, posttussive emesis. He is otherwise healthy, nontoxic appearing on exam. His vital signs are within normal. His oxygen is 100% room air, he is not tachycardic, not hypoxic, not tachypnea. Denies shortness of breath. He has no risk factors for PE. At this time I doubt he has a PE. His lab work shows slightly elevated above blood cell count of 11.9. His creatinine is slightly elevated as well at 1.38. This was discussed with patient, he will need a close follow-up. His chest x-ray shows hazy right middle lobe airspace disease concerning for pneumonia. I will start him on Zithromax, inhaler, prednisone, Tessalon for cough. Will have him follow-up closely with wellness Center. Return precautions discussed. Discharged to return if worsening hemoptysis, shortness of breath, chest pain, any new concerning symptoms. Patient agrees with the plan and voiced understanding.  Filed Vitals:   06/02/14 1202 06/02/14 1300 06/02/14 1530  BP: 134/76 134/73 129/74  Pulse: 91 86 93  Temp: 98.5 F (36.9 C) 99.1 F (37.3 C)   TempSrc: Oral    Resp: 20 16   Height: 6\' 1"  (1.854 m)    Weight: 293 lb (132.904 kg)    SpO2: 100% 100% 98%      Lottie Musselatyana A Yee Gangi, PA-C 06/02/14 2157  Glynn OctaveStephen Rancour, MD 06/02/14 2308

## 2015-01-16 ENCOUNTER — Emergency Department (HOSPITAL_COMMUNITY)
Admission: EM | Admit: 2015-01-16 | Discharge: 2015-01-16 | Disposition: A | Discharge status: Home / Self Care | Payer: Self-pay | Attending: Emergency Medicine | Admitting: Emergency Medicine

## 2015-01-16 ENCOUNTER — Encounter (HOSPITAL_COMMUNITY): Payer: Self-pay | Admitting: Emergency Medicine

## 2015-01-16 DIAGNOSIS — Z79899 Other long term (current) drug therapy: Secondary | ICD-10-CM | POA: Insufficient documentation

## 2015-01-16 DIAGNOSIS — Z202 Contact with and (suspected) exposure to infections with a predominantly sexual mode of transmission: Secondary | ICD-10-CM | POA: Insufficient documentation

## 2015-01-16 DIAGNOSIS — Z720 Tobacco use: Secondary | ICD-10-CM | POA: Insufficient documentation

## 2015-01-16 DIAGNOSIS — Z792 Long term (current) use of antibiotics: Secondary | ICD-10-CM | POA: Insufficient documentation

## 2015-01-16 DIAGNOSIS — Z7952 Long term (current) use of systemic steroids: Secondary | ICD-10-CM | POA: Insufficient documentation

## 2015-01-16 MED ORDER — AZITHROMYCIN 250 MG PO TABS
1000.0000 mg | ORAL_TABLET | Freq: Once | ORAL | Status: AC
  Administered 2015-01-16: 1000 mg via ORAL
  Filled 2015-01-16: qty 4

## 2015-01-16 MED ORDER — CEFTRIAXONE SODIUM 250 MG IJ SOLR
250.0000 mg | Freq: Once | INTRAMUSCULAR | Status: AC
  Administered 2015-01-16: 250 mg via INTRAMUSCULAR
  Filled 2015-01-16: qty 250

## 2015-01-16 NOTE — Discharge Instructions (Signed)
You were not tested for all STDs today. Your gonorrhea and chlamydia tests are pending- if they are positive, you will receive a phone call. Refrain from sex until you have the results from a full STD screen. Please go to [facility] (Address: [address], [place], [place] [address] Phone: [phone]) or see the [facility] (Address: [address] Phone: [phone]) for full STD screening. Return to the emergency room for worsening of symptoms, fever, and vomiting.  Please follow with your primary care doctor in the next 2 days for a check-up. They must obtain records for further management.   Do not hesitate to return to the Emergency Department for any new, worsening or concerning symptoms.   Sexually Transmitted Disease A sexually transmitted disease (STD) is a disease or infection that may be passed (transmitted) from person to person, usually during sexual activity. This may happen by way of saliva, semen, blood, vaginal mucus, or urine. Common STDs include:   Gonorrhea.   Chlamydia.   Syphilis.   HIV and AIDS.   Genital herpes.   Hepatitis B and C.   Trichomonas.   Human papillomavirus (HPV).   Pubic lice.   Scabies.  Mites.  Bacterial vaginosis. WHAT ARE CAUSES OF STDs? An STD may be caused by bacteria, a virus, or parasites. STDs are often transmitted during sexual activity if one person is infected. However, they may also be transmitted through nonsexual means. STDs may be transmitted after:   Sexual intercourse with an infected person.   Sharing sex toys with an infected person.   Sharing needles with an infected person or using unclean piercing or tattoo needles.  Having intimate contact with the genitals, mouth, or rectal areas of an infected person.   Exposure to infected fluids during birth. WHAT ARE THE SIGNS AND SYMPTOMS OF STDs? Different STDs have different symptoms. Some people may not have any  symptoms. If symptoms are present, they may include:   Painful or bloody urination.   Pain in the pelvis, abdomen, vagina, anus, throat, or eyes.   A skin rash, itching, or irritation.  Growths, ulcerations, blisters, or sores in the genital and anal areas.  Abnormal vaginal discharge with or without bad odor.   Penile discharge in men.   Fever.   Pain or bleeding during sexual intercourse.   Swollen glands in the groin area.   Yellow skin and eyes (jaundice). This is seen with hepatitis.   Swollen testicles.  Infertility.  Sores and blisters in the mouth. HOW ARE STDs DIAGNOSED? To make a diagnosis, your health care provider may:   Take a medical history.   Perform a physical exam.   Take a sample of any discharge to examine.  Swab the throat, cervix, opening to the penis, rectum, or vagina for testing.  Test a sample of your first morning urine.   Perform blood tests.   Perform a Pap test, if this applies.   Perform a colposcopy.   Perform a laparoscopy.  HOW ARE STDs TREATED? Treatment depends on the STD. Some STDs may be treated but not cured.   Chlamydia, gonorrhea, trichomonas, and syphilis can be cured with antibiotic medicine.   Genital herpes, hepatitis, and HIV can be treated, but not cured, with prescribed medicines. The medicines lessen symptoms.   Genital warts from HPV can be treated with medicine or by freezing, burning (electrocautery), or surgery. Warts may come back.   HPV cannot be cured with medicine or surgery. However, abnormal  areas may be removed from the cervix, vagina, or vulva.   If your diagnosis is confirmed, your recent sexual partners need treatment. This is true even if they are symptom-free or have a negative culture or evaluation. They should not have sex until their health care providers say it is okay. HOW CAN I REDUCE MY RISK OF GETTING AN STD? Take these steps to reduce your risk of getting an  STD:  Use latex condoms, dental dams, and water-soluble lubricants during sexual activity. Do not use petroleum jelly or oils.  Avoid having multiple sex partners.  Do not have sex with someone who has other sex partners.  Do not have sex with anyone you do not know or who is at high risk for an STD.  Avoid risky sex practices that can break your skin.  Do not have sex if you have open sores on your mouth or skin.  Avoid drinking too much alcohol or taking illegal drugs. Alcohol and drugs can affect your judgment and put you in a vulnerable position.  Avoid engaging in oral and anal sex acts.  Get vaccinated for HPV and hepatitis. If you have not received these vaccines in the past, talk to your health care provider about whether one or both might be right for you.   If you are at risk of being infected with HIV, it is recommended that you take a prescription medicine daily to prevent HIV infection. This is called pre-exposure prophylaxis (PrEP). You are considered at risk if:  You are a man who has sex with other men (MSM).  You are a heterosexual man or woman and are sexually active with more than one partner.  You take drugs by injection.  You are sexually active with a partner who has HIV.  Talk with your health care provider about whether you are at high risk of being infected with HIV. If you choose to begin PrEP, you should first be tested for HIV. You should then be tested every 3 months for as long as you are taking PrEP.  WHAT SHOULD I DO IF I THINK I HAVE AN STD?  See your health care provider.   Tell your sexual partner(s). They should be tested and treated for any STDs.  Do not have sex until your health care provider says it is okay. WHEN SHOULD I GET IMMEDIATE MEDICAL CARE? Contact your health care provider right away if:   You have severe abdominal pain.  You are a man and notice swelling or pain in your testicles.  You are a woman and notice swelling  or pain in your vagina. Document Released: 08/13/2002 Document Revised: 05/28/2013 Document Reviewed: 12/11/2012 [facility] Patient Information 2015 [place], [place]. This information is not intended to replace advice given to you by your health care provider. Make sure you discuss any questions you have with your health care provider.

## 2015-01-16 NOTE — ED Provider Notes (Signed)
CSN: [id number]     Arrival date & time 01/16/15  1108 History  This chart was scribed for non-physician practitioner Darryl Emery, PA-C working with Darryl Guise, MD by Darryl Rice, ED Scribe. This patient was seen in room TR07C/TR07C and the patient's care was started at 12:12 PM.       Chief Complaint  Patient presents with  . SEXUALLY TRANSMITTED DISEASE   The history is provided by the patient. No language interpreter was used.   HPI Comments: Darryl Rice is a 24 y.o. male who presents to the Emergency Department complaining of exposure to chlamydia. Patient states that his partner recently tested positive for chlamydia. He denies urinary frequency, dysuria, abdominal pain, testicular pain, and testicular swelling, fever, chills.   History reviewed. No pertinent past medical history. History reviewed. No pertinent past surgical history. No family history on file. Social History  Substance Use Topics  . Smoking status: Current Every Day Smoker -- 0.50 packs/day    Types: Cigarettes  . Smokeless tobacco: None  . Alcohol Use: Yes     Comment: occ    Review of Systems A complete 10 system review of systems was obtained and all systems are negative except as noted in the HPI and PMH.     Allergies  Review of patient's allergies indicates no known allergies.  Home Medications   Prior to Admission medications   Medication Sig Start Date End Date Taking? Authorizing Provider  azithromycin (ZITHROMAX) 250 MG tablet Take one tab PO daily. 06/02/14   Darryl Kirichenko, PA-C  benzonatate (TESSALON) 100 MG capsule Take 1 capsule (100 mg total) by mouth every 8 (eight) hours. 06/02/14   Darryl Kirichenko, PA-C  predniSONE (DELTASONE) 10 MG tablet Take 5 tab day 1, take 4 tab day 2, take 3 tab day 3, take 2 tab day 4, and take 1 tab day 5 06/02/14   Darryl Kirichenko, PA-C   BP 119/64 mmHg  Pulse 72  Temp(Src) 98.3 F (36.8 C) (Oral)  Resp 14  SpO2 100% Physical Exam   Constitutional: He is oriented to person, place, and time. He appears well-developed and well-nourished. No distress.  HENT:  Head: Normocephalic and atraumatic.  Mouth/Throat: Oropharynx is clear and moist. No oropharyngeal exudate.  Eyes: Pupils are equal, round, and reactive to light.  Neck: Neck supple.  Cardiovascular: Normal rate.   Pulmonary/Chest: Effort normal.  Genitourinary:  Small amount of purulent discharge at the urethral meatus.  Musculoskeletal: He exhibits no edema.  Neurological: He is alert and oriented to person, place, and time. No cranial nerve deficit.  Skin: Skin is warm and dry. No rash noted.  Psychiatric: He has a normal mood and affect. His behavior is normal.  Nursing note and vitals reviewed.   ED Course  Procedures  DIAGNOSTIC STUDIES: Oxygen Saturation is 100% on room air, normal by my interpretation.    COORDINATION OF CARE: 12:14 PM-Discussed treatment plan which includes STD testing and medications with patient/guardian at bedside and patient/guardian agreed to plan.    Labs Review Labs Reviewed - No data to display  Imaging Review No results found. I personally reviewed and evaluated these images and lab results as part of my medical decision-making.   EKG Interpretation None      MDM   Final diagnoses:  Exposure to STD    Filed Vitals:   01/16/15 1114 01/16/15 1254  BP: 119/64 137/75  Pulse: 72 64  Temp: 98.3 F (36.8 C)   TempSrc:  Oral   Resp: 14 16  SpO2: 100% 100%    Medications  cefTRIAXone (ROCEPHIN) injection 250 mg (250 mg Intramuscular Given 01/16/15 1233)  azithromycin (ZITHROMAX) tablet 1,000 mg (1,000 mg Oral Given 01/16/15 1233)    Darryl Rice is a pleasant 24 y.o. male presenting with with exposure to chlamydia last week. Patient states he is asymptomatic but there is purulent discharge when I have obtained the urethral swab. We'll treat for GC and chlamydia. Patient has full STD screen to be  followed up by infectious disease.  Evaluation does not show pathology that would require ongoing emergent intervention or inpatient treatment. Pt is hemodynamically stable and mentating appropriately. Discussed findings and plan with patient/guardian, who agrees with care plan. All questions answered. Return precautions discussed and outpatient follow up given.    I personally performed the services described in this documentation, which was scribed in my presence. The recorded information has been reviewed and is accurate.    Darryl Emery, PA-C 01/16/15 1709  Darryl Guise, MD 01/16/15 2025

## 2015-01-16 NOTE — ED Notes (Signed)
Pt reports exposure to Chlamydia last week.

## 2015-01-17 LAB — HIV ANTIBODY (ROUTINE TESTING W REFLEX): HIV Screen 4th Generation wRfx: NONREACTIVE

## 2015-01-17 LAB — RPR: RPR Ser Ql: NONREACTIVE

## 2015-01-19 LAB — GC/CHLAMYDIA PROBE AMP (~~LOC~~) NOT AT ARMC
Chlamydia: POSITIVE — AB
Neisseria Gonorrhea: NEGATIVE

## 2015-01-20 ENCOUNTER — Telehealth (HOSPITAL_BASED_OUTPATIENT_CLINIC_OR_DEPARTMENT_OTHER): Payer: Self-pay | Admitting: Emergency Medicine

## 2016-05-08 ENCOUNTER — Emergency Department (HOSPITAL_COMMUNITY): Payer: Self-pay

## 2016-05-08 ENCOUNTER — Emergency Department (HOSPITAL_COMMUNITY)
Admission: EM | Admit: 2016-05-08 | Discharge: 2016-05-08 | Disposition: A | Discharge status: Home / Self Care | Payer: Self-pay | Attending: Emergency Medicine | Admitting: Emergency Medicine

## 2016-05-08 ENCOUNTER — Encounter (HOSPITAL_COMMUNITY): Payer: Self-pay | Admitting: Emergency Medicine

## 2016-05-08 DIAGNOSIS — W1839XA Other fall on same level, initial encounter: Secondary | ICD-10-CM | POA: Insufficient documentation

## 2016-05-08 DIAGNOSIS — F17210 Nicotine dependence, cigarettes, uncomplicated: Secondary | ICD-10-CM | POA: Insufficient documentation

## 2016-05-08 DIAGNOSIS — Y999 Unspecified external cause status: Secondary | ICD-10-CM | POA: Insufficient documentation

## 2016-05-08 DIAGNOSIS — Y939 Activity, unspecified: Secondary | ICD-10-CM | POA: Insufficient documentation

## 2016-05-08 DIAGNOSIS — R0789 Other chest pain: Secondary | ICD-10-CM | POA: Insufficient documentation

## 2016-05-08 DIAGNOSIS — S40021A Contusion of right upper arm, initial encounter: Secondary | ICD-10-CM | POA: Insufficient documentation

## 2016-05-08 DIAGNOSIS — R55 Syncope and collapse: Secondary | ICD-10-CM | POA: Insufficient documentation

## 2016-05-08 DIAGNOSIS — Y929 Unspecified place or not applicable: Secondary | ICD-10-CM | POA: Insufficient documentation

## 2016-05-08 DIAGNOSIS — Z79899 Other long term (current) drug therapy: Secondary | ICD-10-CM | POA: Insufficient documentation

## 2016-05-08 LAB — BASIC METABOLIC PANEL
ANION GAP: 8 (ref 5–15)
BUN: 19 mg/dL (ref 6–20)
CO2: 26 mmol/L (ref 22–32)
Calcium: 9.4 mg/dL (ref 8.9–10.3)
Chloride: 103 mmol/L (ref 101–111)
Creatinine, Ser: 1.38 mg/dL — ABNORMAL HIGH (ref 0.61–1.24)
GFR calc Af Amer: >60 mL/min (ref >60)
GFR calc non Af Amer: >60 mL/min (ref >60)
GLUCOSE: 116 mg/dL — AB (ref 65–99)
POTASSIUM: 3.6 mmol/L (ref 3.5–5.1)
Sodium: 137 mmol/L (ref 135–145)

## 2016-05-08 LAB — CBC
HCT: 41.5 % (ref 39.0–52.0)
HEMOGLOBIN: 13.2 g/dL (ref 13.0–17.0)
MCH: 24.4 pg — AB (ref 26.0–34.0)
MCHC: 31.8 g/dL (ref 30.0–36.0)
MCV: 76.6 fL — ABNORMAL LOW (ref 78.0–100.0)
Platelets: 190 10*3/uL (ref 150–400)
RBC: 5.42 MIL/uL (ref 4.22–5.81)
RDW: 13.5 % (ref 11.5–15.5)
WBC: 9.2 10*3/uL (ref 4.0–10.5)

## 2016-05-08 LAB — I-STAT TROPONIN, ED: Troponin i, poc: 0 ng/mL (ref 0.00–0.08)

## 2016-05-08 MED ORDER — IBUPROFEN 200 MG PO TABS
600.0000 mg | ORAL_TABLET | Freq: Once | ORAL | Status: AC
  Administered 2016-05-08: 600 mg via ORAL
  Filled 2016-05-08: qty 3

## 2016-05-08 MED ORDER — HYDROXYZINE HCL 25 MG PO TABS: 25.0000 mg | ORAL_TABLET | Freq: Four times a day (QID) | ORAL | 0 refills | Status: DC

## 2016-05-08 MED ORDER — SODIUM CHLORIDE 0.9 % IV BOLUS (SEPSIS): 1000.0000 mL | Freq: Once | INTRAVENOUS | Status: DC

## 2016-05-08 MED ORDER — LORAZEPAM 1 MG PO TABS
1.0000 mg | ORAL_TABLET | Freq: Once | ORAL | Status: AC
  Administered 2016-05-08: 1 mg via ORAL
  Filled 2016-05-08: qty 1

## 2016-05-08 MED ORDER — HYDROXYZINE PAMOATE 100 MG PO CAPS: 100.0000 mg | ORAL_CAPSULE | Freq: Three times a day (TID) | ORAL | 0 refills | Status: DC | PRN

## 2016-05-08 NOTE — Discharge Instructions (Signed)
Please follow up with a family doctor

## 2016-05-08 NOTE — ED Provider Notes (Signed)
[facility]-EMERGENCY DEPT Provider Note   CSN: [id number] Arrival date & time: 05/08/16  1102     History   Chief Complaint Chief Complaint  Patient presents with  . Dizziness  . Chest Pain    HPI Darryl Rice is a 25 y.o. male who presents with lightheadedness and chest pain. No significant PMH. He states that on Friday he was hanging out with friends and laughed really hard and suddenly became lightheaded and had a near syncopal episode. He fell forward on to a wooden table which broke his fall and hurt his right arm. He denies any ETOH use however does report smoking marijuana. He started to have chest pain ~4 hours afterwards. The chest pain is substernal, intermittent lasting about 30 seconds to a minute, feels like a pressure or squeezing. He also reports lightheadedness after he stands for several minutes but also states he feels very anxious about having another near syncopal episode. Overall he reports being generally healthy. He is a current tobacco user. He denies fever, chills, palpitations, leg swelling, SOB, cough, abdominal pain, N/V. He has been able to eat but has a decreased PO intake. He is also reporting significant pain wear blood was drawn.   HPI  History reviewed. No pertinent past medical history.  Patient Active Problem List   Diagnosis Date Noted  . ATTENTION DEFICIT, W/HYPERACTIVITY 08/03/2006    History reviewed. No pertinent surgical history.     Home Medications    Prior to Admission medications   Medication Sig Start Date End Date Taking? Authorizing Provider  azithromycin (ZITHROMAX) 250 MG tablet Take one tab PO daily. 06/02/14   Tatyana Kirichenko, PA-C  benzonatate (TESSALON) 100 MG capsule Take 1 capsule (100 mg total) by mouth every 8 (eight) hours. 06/02/14   Tatyana Kirichenko, PA-C  predniSONE (DELTASONE) 10 MG tablet Take 5 tab day 1, take 4 tab day 2, take 3 tab day 3, take 2 tab day 4, and take 1 tab day 5 06/02/14   Jaynie Crumble, PA-C    Family History No family history on file.  Social History Social History  Substance Use Topics  . Smoking status: Current Every Day Smoker    Packs/day: 0.50    Types: Cigarettes  . Smokeless tobacco: Never Used  . Alcohol use Yes     Comment: occ     Allergies   Patient has no known allergies.   Review of Systems Review of Systems  Constitutional: Positive for appetite change. Negative for chills and fever.  Respiratory: Negative for cough and shortness of breath.   Cardiovascular: Positive for chest pain. Negative for palpitations and leg swelling.  Gastrointestinal: Negative for abdominal pain, nausea and vomiting.  Skin: Positive for color change (right arm).  Neurological: Positive for light-headedness.  Psychiatric/Behavioral: The patient is not nervous/anxious.      Physical Exam Updated Vital Signs BP 143/77 (BP Location: Left Arm)   Pulse 85   Temp 98.4 F (36.9 C) (Oral)   Resp 20   Ht 6\' 1"  (1.854 m)   SpO2 99%   Physical Exam  Constitutional: He is oriented to person, place, and time. He appears well-developed and well-nourished. He appears distressed (Tearful).  HENT:  Head: Normocephalic and atraumatic.  Eyes: Conjunctivae are normal. Pupils are equal, round, and reactive to light. Right eye exhibits no discharge. Left eye exhibits no discharge. No scleral icterus.  Neck: Normal range of motion.  Cardiovascular: Normal rate and regular rhythm.  Exam reveals no  gallop and no friction rub.   No murmur heard. Pulmonary/Chest: Effort normal and breath sounds normal. No respiratory distress. He has no wheezes. He has no rales. He exhibits no tenderness.  Abdominal: Soft. Bowel sounds are normal. He exhibits no distension and no mass. There is no tenderness. There is no rebound and no guarding. No hernia.  Musculoskeletal:  Ecchymosis of right arm  Neurological: He is alert and oriented to person, place, and time.  Mental Status:    Alert, oriented, thought content appropriate, able to give a coherent history. Speech fluent without evidence of aphasia. Able to follow 2 step commands without difficulty.  Cranial Nerves:  II:  Peripheral visual fields grossly normal, pupils equal, round, reactive to light III,IV, VI: ptosis not present, extra-ocular motions intact bilaterally  V,VII: smile symmetric, facial light touch sensation equal VIII: hearing grossly normal to voice  X: uvula elevates symmetrically  XI: bilateral shoulder shrug symmetric and strong XII: midline tongue extension without fassiculations Motor:  Normal tone. 5/5 in upper and lower extremities bilaterally including strong and equal grip strength and dorsiflexion/plantar flexion Sensory: Pinprick and light touch normal in all extremities.  Cerebellar: normal finger-to-nose with bilateral upper extremities Gait: normal gait and balance CV: distal pulses palpable throughout    Skin: Skin is warm and dry.  Psychiatric: He has a normal mood and affect. His behavior is normal.  Nursing note and vitals reviewed.    ED Treatments / Results  Labs (all labs ordered are listed, but only abnormal results are displayed) Labs Reviewed  CBC - Abnormal; Notable for the following:       Result Value   MCV 76.6 (*)    MCH 24.4 (*)    All other components within normal limits  BASIC METABOLIC PANEL  I-STAT TROPOININ, ED    EKG  EKG Interpretation  Date/Time:  Sunday May 08 2016 11:18:35 EST Ventricular Rate:  85 PR Interval:    QRS Duration: 83 QT Interval:  358 QTC Calculation: 426 R Axis:   73 Text Interpretation:  Sinus rhythm Baseline wander in lead(s) II III aVL aVF Otherwise no significant change Confirmed by Adela LankFLOYD MD, Reuel BoomANIEL [phone]([id number]) on 05/08/2016 11:38:41 AM       Radiology Dg Chest 2 View  Result Date: 05/08/2016 CLINICAL DATA:  Pt stated that on Friday he blacked out and passed out. On sat, he had mid chest pain, dizziness, and  all over body pain.Smoker- .5 pack/day EXAM: CHEST  2 VIEW COMPARISON:  06/02/2014 FINDINGS: The heart size and mediastinal contours are within normal limits. Both lungs are clear. No pleural effusion or pneumothorax. The visualized skeletal structures are unremarkable. IMPRESSION: Normal chest radiographs. Electronically Signed   By: Amie Portlandavid  Ormond M.D.   On: 05/08/2016 12:33    Procedures Procedures (including critical care time)  Medications Ordered in ED Medications  ibuprofen (ADVIL,MOTRIN) tablet 600 mg (600 mg Oral Given 05/08/16 1240)  LORazepam (ATIVAN) tablet 1 mg (1 mg Oral Given 05/08/16 1240)     Initial Impression / Assessment and Plan / ED Course  I have reviewed the triage vital signs and the nursing notes.  Pertinent labs & imaging results that were available during my care of the patient were reviewed by me and considered in my medical decision making (see chart for details).  Clinical Course    25 year old male presents with near syncope and chest pain. Patient is afebrile, not tachycardic or tachypneic, and not hypoxic. His BP is slightly elevated.  Chest pain work up is reassuring. EKG is NSR. CXR is negative. Initial troponin is 0. Do not feel second is warranted since CP is very atypical. Labs are remarkable for mild elevated in SCr. No significant past or family hx of cardiac disease. HEART score is 1. Patient is very anxious and tearful. Ativan and Ibuprofen given with improvement.  Discussed results with patient. I think his symptoms started from smoking marijuana and possibly dehydration and now he is very anxious about having another episode. Neuro exam is normal. Encouraged establishing care with a PCP and close follow up. Vistaril rx given. Patient is NAD, non-toxic, with stable VS. Patient is informed of clinical course, understands medical decision making process, and agrees with plan. Opportunity for questions provided and all questions answered. Return precautions  given.    Final Clinical Impressions(s) / ED Diagnoses   Final diagnoses:  Atypical chest pain  Near syncope    New Prescriptions New Prescriptions   HYDROXYZINE (VISTARIL) 100 MG CAPSULE    Take 1 capsule (100 mg total) by mouth 3 (three) times daily as needed for itching.     Bethel BornKelly Marie Emberlie Gotcher, PA-C 05/08/16 1315    Melene Planan Floyd, DO 05/08/16 [phone]

## 2016-05-08 NOTE — ED Triage Notes (Signed)
Pt states that on Friday he was hanging out with his home[employer] laughing and playing games. He felt dizzy and went to grab onto side of coach and ended up falling through coffee table. Patient has bruising to right arm "which is changing colors". Patient also c/o central chest pain and continued dizziness.

## 2016-05-08 NOTE — ED Notes (Signed)
Pt refused PIV, PA Kelly notified.

## 2016-05-08 NOTE — ED Notes (Signed)
Patient called back, currently in bathroom

## 2016-09-07 ENCOUNTER — Emergency Department (HOSPITAL_COMMUNITY)
Admission: EM | Admit: 2016-09-07 | Discharge: 2016-09-07 | Disposition: A | Discharge status: Home / Self Care | Payer: Self-pay | Attending: Emergency Medicine | Admitting: Emergency Medicine

## 2016-09-07 ENCOUNTER — Emergency Department (HOSPITAL_COMMUNITY): Payer: Self-pay

## 2016-09-07 ENCOUNTER — Encounter (HOSPITAL_COMMUNITY): Payer: Self-pay

## 2016-09-07 DIAGNOSIS — F17210 Nicotine dependence, cigarettes, uncomplicated: Secondary | ICD-10-CM | POA: Insufficient documentation

## 2016-09-07 DIAGNOSIS — R05 Cough: Secondary | ICD-10-CM | POA: Insufficient documentation

## 2016-09-07 DIAGNOSIS — R0789 Other chest pain: Secondary | ICD-10-CM

## 2016-09-07 DIAGNOSIS — R0602 Shortness of breath: Secondary | ICD-10-CM | POA: Insufficient documentation

## 2016-09-07 DIAGNOSIS — R059 Cough, unspecified: Secondary | ICD-10-CM

## 2016-09-07 DIAGNOSIS — R112 Nausea with vomiting, unspecified: Secondary | ICD-10-CM

## 2016-09-07 LAB — BASIC METABOLIC PANEL
ANION GAP: 10 (ref 5–15)
BUN: 14 mg/dL (ref 6–20)
CHLORIDE: 102 mmol/L (ref 101–111)
CO2: 23 mmol/L (ref 22–32)
Calcium: 9.4 mg/dL (ref 8.9–10.3)
Creatinine, Ser: 1.41 mg/dL — ABNORMAL HIGH (ref 0.61–1.24)
Glucose, Bld: 96 mg/dL (ref 65–99)
POTASSIUM: 3.7 mmol/L (ref 3.5–5.1)
SODIUM: 135 mmol/L (ref 135–145)

## 2016-09-07 LAB — CBC
HEMATOCRIT: 42.8 % (ref 39.0–52.0)
Hemoglobin: 14 g/dL (ref 13.0–17.0)
MCH: 25 pg — ABNORMAL LOW (ref 26.0–34.0)
MCHC: 32.7 g/dL (ref 30.0–36.0)
MCV: 76.4 fL — ABNORMAL LOW (ref 78.0–100.0)
PLATELETS: 168 10*3/uL (ref 150–400)
RBC: 5.6 MIL/uL (ref 4.22–5.81)
RDW: 13.3 % (ref 11.5–15.5)
WBC: 9.5 10*3/uL (ref 4.0–10.5)

## 2016-09-07 LAB — I-STAT TROPONIN, ED: Troponin i, poc: 0 ng/mL (ref 0.00–0.08)

## 2016-09-07 MED ORDER — IBUPROFEN 400 MG PO TABS
600.0000 mg | ORAL_TABLET | Freq: Once | ORAL | Status: AC
  Administered 2016-09-07: 600 mg via ORAL
  Filled 2016-09-07: qty 1

## 2016-09-07 NOTE — ED Provider Notes (Signed)
[facility] Provider Note   CSN: [id number] Arrival date & time: 09/07/16  1203  By signing my name below, I, Teofilo Pod, attest that this documentation has been prepared under the direction and in the presence of Nira Conn, MD . Electronically Signed: Teofilo Pod, ED Scribe. 09/07/2016. 1:50 PM.    History   Chief Complaint Chief Complaint  Patient presents with  . Chest Pain  . Emesis  . Shortness of Breath   The history is provided by the patient. No language interpreter was used.   HPI Comments:  Darryl Rice is a 26 y.o. male who presents to the Emergency Department complaining of intermittent chest pain x 4 month. He states that he has pain 3-4 times a day, and it worsens when bending down or chest movement. Pt reports that he had a syncopal episode 4 months ago resulting in fall, bilateral arm and chest trauma. He was seen at [facility] with negative work up. Pt complains of associated nausea, diaphoresis, chills, nasal congestion, vomiting x 1 yesterday. He states that he feels like he gets lightheaded and dizzy whenever he coughs or laughs, and his chest pain worsens. He believes that these symptoms are due to a possible pneumonia as this is how he felt with previous PNA . Pt denies hx of heart problems, HTN, DM, high BP, DVT/PE, denies recent long travel, and does not smoke. He denies any family hx of cardiac problems. He states that he is otherwise healthy. No alleviating factors noted. Denies leg swelling.    History reviewed. No pertinent past medical history.  Patient Active Problem List   Diagnosis Date Noted  . ATTENTION DEFICIT, W/HYPERACTIVITY 08/03/2006    History reviewed. No pertinent surgical history.     Home Medications    Prior to Admission medications   Medication Sig Start Date End Date Taking? Authorizing Provider  azithromycin (ZITHROMAX) 250 MG tablet Take one tab PO daily. 06/02/14   Tatyana Kirichenko, PA-C    benzonatate (TESSALON) 100 MG capsule Take 1 capsule (100 mg total) by mouth every 8 (eight) hours. 06/02/14   Tatyana Kirichenko, PA-C  hydrOXYzine (VISTARIL) 100 MG capsule Take 1 capsule (100 mg total) by mouth 3 (three) times daily as needed for itching. 05/08/16   Bethel Born, PA-C  predniSONE (DELTASONE) 10 MG tablet Take 5 tab day 1, take 4 tab day 2, take 3 tab day 3, take 2 tab day 4, and take 1 tab day 5 06/02/14   Jaynie Crumble, PA-C    Family History History reviewed. No pertinent family history.  Social History Social History  Substance Use Topics  . Smoking status: Current Every Day Smoker    Packs/day: 0.50    Types: Cigarettes  . Smokeless tobacco: Never Used  . Alcohol use Yes     Comment: occ     Allergies   Patient has no known allergies.   Review of Systems Review of Systems 10 Systems reviewed and are negative for acute change except as noted in the HPI.   Physical Exam Updated Vital Signs BP (!) 143/85   Pulse 94   Temp 99.2 F (37.3 C) (Oral)   Resp 18   Ht  (1.854 m)   Wt 300 lb (136.1 kg)   SpO2 100%   BMI 39.58 kg/m   Physical Exam  Constitutional: He is oriented to person, place, and time. He appears well-developed and well-nourished. No distress.  HENT:  Head: Normocephalic and  atraumatic.  Nose: Nose normal.  Eyes: Conjunctivae and EOM are normal. Pupils are equal, round, and reactive to light. Right eye exhibits no discharge. Left eye exhibits no discharge. No scleral icterus.  Neck: Normal range of motion. Neck supple.  Cardiovascular: Normal rate and regular rhythm.  Exam reveals no gallop and no friction rub.   No murmur heard. Pulmonary/Chest: Effort normal and breath sounds normal. No stridor. No respiratory distress. He has no rales.  Abdominal: Soft. He exhibits no distension. There is no tenderness.  Musculoskeletal: He exhibits no edema or tenderness.  Neurological: He is alert and oriented to person, place,  and time.  Skin: Skin is warm and dry. No rash noted. He is not diaphoretic. No erythema.  Psychiatric: He has a normal mood and affect.  Vitals reviewed.    ED Treatments / Results  DIAGNOSTIC STUDIES:  Oxygen Saturation is 100% on RA, normal by my interpretation.    COORDINATION OF CARE:  1:47 PM Discussed treatment plan with pt at bedside and pt agreed to plan.   Labs (all labs ordered are listed, but only abnormal results are displayed) Labs Reviewed  BASIC METABOLIC PANEL - Abnormal; Notable for the following:       Result Value   Creatinine, Ser 1.41 (*)    All other components within normal limits  CBC - Abnormal; Notable for the following:    MCV 76.4 (*)    MCH 25.0 (*)    All other components within normal limits  I-STAT TROPOININ, ED    EKG  EKG Interpretation  Date/Time:  Wednesday September 07 2016 12:10:23 EDT Ventricular Rate:  91 PR Interval:  152 QRS Duration: 76 QT Interval:  332 QTC Calculation: 408 R Axis:   79 Text Interpretation:  Normal sinus rhythm with sinus arrhythmia Nonspecific ST and T wave abnormality Abnormal ECG No significant change since last tracing Confirmed by [facility] MD, Tecia Cinnamon ([id number]) on 09/07/2016 12:52:47 PM       Radiology Dg Chest 2 View  Result Date: 09/07/2016 CLINICAL DATA:  Chest pain, nausea, and diaphoresis which began last night. Near syncopal sensation associated with coughing or laughing for the past several months. Current smoker. History of previous episodes of pneumonia. EXAM: CHEST  2 VIEW COMPARISON:  Chest x-ray of May 08, 2016 FINDINGS: The lungs are well-expanded and clear. The heart and mediastinal structures are normal. The trachea is midline. There is no pleural effusion. The bony thorax exhibits no acute abnormality. IMPRESSION: No pneumonia, CHF, nor other acute cardiopulmonary abnormality. Electronically Signed   By: David  [place] M.D.   On: 09/07/2016 12:31    Procedures Procedures (including critical  care time)  Medications Ordered in ED Medications  ibuprofen (ADVIL,MOTRIN) tablet 600 mg (600 mg Oral Given 09/07/16 1354)     Initial Impression / Assessment and Plan / ED Course  I have reviewed the triage vital signs and the nursing notes.  Pertinent labs & imaging results that were available during my care of the patient were reviewed by me and considered in my medical decision making (see chart for details).     Atypical chest pain highly inconsistent with ACS. EKG without acute ischemic changes or evidence of pericarditis. Initial troponin drawn in triage negative. Do not feel that additional cardiac workup is required at this time. Chest x-ray without evidence suggestive of pneumonia, pneumothorax, pneumomediastinum.  No abnormal contour of the mediastinum to suggest dissection. No evidence of acute injuries. Low pretest probability for pulmonary embolism. Presentation  not classic for aortic dissection or esophageal perforation.  Chest pain is likely secondary to chest wall pain due to prior trauma as it is exacerbated with chest wall movement. Low concern for any other serious/emergent etiologies.  Patient also has symptoms of likely viral URI. Chest x-ray without evidence of pneumonia. He is well-appearing, well-hydrated, nontoxic.  The patient is safe for discharge with strict return precautions.  Final Clinical Impressions(s) / ED Diagnoses   Final diagnoses:  Chest wall pain  Nausea and vomiting, intractability of vomiting not specified, unspecified vomiting type  Cough   Disposition: Discharge  Condition: Good  I have discussed the results, Dx and Tx plan with the patient who expressed understanding and agree(s) with the plan. Discharge instructions discussed at great length. The patient was given strict return precautions who verbalized understanding of the instructions. No further questions at time of discharge.    New Prescriptions   No medications on file     Follow Up: primary care provider  Schedule an appointment as soon as possible for a visit  As needed   I personally performed the services described in this documentation, which was scribed in my presence. The recorded information has been reviewed and is accurate.        Nira Conn, MD 09/07/16 [phone]

## 2016-09-07 NOTE — ED Triage Notes (Addendum)
Pt presents with complaints of chest pain, nausea and diaphoresis that started last night, states that he feels like he is going to pass out every time he coughs or laughs for the last couple months. Patient is in no apparent distress in triage. States this is how he felt the last time he had pneumonia

## 2017-06-07 ENCOUNTER — Emergency Department (HOSPITAL_COMMUNITY)
Admission: EM | Admit: 2017-06-07 | Discharge: 2017-06-07 | Disposition: A | Discharge status: Home / Self Care | Payer: Self-pay | Attending: Emergency Medicine | Admitting: Emergency Medicine

## 2017-06-07 ENCOUNTER — Encounter (HOSPITAL_COMMUNITY): Payer: Self-pay | Admitting: Emergency Medicine

## 2017-06-07 ENCOUNTER — Emergency Department (HOSPITAL_COMMUNITY): Payer: Self-pay

## 2017-06-07 DIAGNOSIS — R079 Chest pain, unspecified: Secondary | ICD-10-CM | POA: Insufficient documentation

## 2017-06-07 DIAGNOSIS — Z5321 Procedure and treatment not carried out due to patient leaving prior to being seen by health care provider: Secondary | ICD-10-CM | POA: Insufficient documentation

## 2017-06-07 LAB — BASIC METABOLIC PANEL
ANION GAP: 8 (ref 5–15)
BUN: 14 mg/dL (ref 6–20)
CALCIUM: 9.3 mg/dL (ref 8.9–10.3)
CO2: 25 mmol/L (ref 22–32)
CREATININE: 1.65 mg/dL — AB (ref 0.61–1.24)
Chloride: 100 mmol/L — ABNORMAL LOW (ref 101–111)
GFR calc Af Amer: >60 mL/min (ref >60)
GFR, EST NON AFRICAN AMERICAN: 56 mL/min — AB (ref >60)
GLUCOSE: 99 mg/dL (ref 65–99)
Potassium: 3.6 mmol/L (ref 3.5–5.1)
Sodium: 133 mmol/L — ABNORMAL LOW (ref 135–145)

## 2017-06-07 LAB — CBC
HCT: 41.4 % (ref 39.0–52.0)
Hemoglobin: 13.6 g/dL (ref 13.0–17.0)
MCH: 25.4 pg — AB (ref 26.0–34.0)
MCHC: 32.9 g/dL (ref 30.0–36.0)
MCV: 77.4 fL — ABNORMAL LOW (ref 78.0–100.0)
PLATELETS: 166 10*3/uL (ref 150–400)
RBC: 5.35 MIL/uL (ref 4.22–5.81)
RDW: 13.4 % (ref 11.5–15.5)
WBC: 9.9 10*3/uL (ref 4.0–10.5)

## 2017-06-07 LAB — I-STAT TROPONIN, ED: TROPONIN I, POC: 0 ng/mL (ref 0.00–0.08)

## 2017-06-07 LAB — URINALYSIS, ROUTINE W REFLEX MICROSCOPIC
BILIRUBIN URINE: NEGATIVE
GLUCOSE, UA: NEGATIVE mg/dL
Hgb urine dipstick: NEGATIVE
KETONES UR: NEGATIVE mg/dL
Leukocytes, UA: NEGATIVE
Nitrite: NEGATIVE
PH: 5 (ref 5.0–8.0)
Protein, ur: NEGATIVE mg/dL
SPECIFIC GRAVITY, URINE: 1.028 (ref 1.005–1.030)

## 2017-06-07 LAB — LIPASE, BLOOD: Lipase: 23 U/L (ref 11–51)

## 2017-06-07 MED ORDER — ACETAMINOPHEN 325 MG PO TABS
650.0000 mg | ORAL_TABLET | Freq: Once | ORAL | Status: AC | PRN
  Administered 2017-06-07: 650 mg via ORAL
  Filled 2017-06-07: qty 2

## 2017-06-07 NOTE — ED Notes (Signed)
Called x1 to be placed in treatment room with no answer.

## 2017-06-07 NOTE — ED Notes (Signed)
Pt had drawn for labs:  Gold Blue Lavender Lt green Dark green x2 

## 2017-06-07 NOTE — ED Triage Notes (Addendum)
Pt reports CP, SOB and dizziness for the past few days accompanied by fever and HA. Pt reports he feels similar to when he had PNA. Also has n/v/d

## 2017-06-07 NOTE — ED Notes (Signed)
Pt called for room, no responce 

## 2017-08-29 ENCOUNTER — Ambulatory Visit (HOSPITAL_COMMUNITY)
Admission: EM | Admit: 2017-08-29 | Discharge: 2017-08-29 | Disposition: A | Discharge status: Home / Self Care | Payer: Self-pay | Attending: Family Medicine | Admitting: Family Medicine

## 2017-08-29 ENCOUNTER — Ambulatory Visit (INDEPENDENT_AMBULATORY_CARE_PROVIDER_SITE_OTHER): Payer: Self-pay

## 2017-08-29 ENCOUNTER — Encounter (HOSPITAL_COMMUNITY): Payer: Self-pay | Admitting: Family Medicine

## 2017-08-29 DIAGNOSIS — M25562 Pain in left knee: Secondary | ICD-10-CM

## 2017-08-29 MED ORDER — IBUPROFEN 800 MG PO TABS: 800.0000 mg | ORAL_TABLET | Freq: Three times a day (TID) | ORAL | 0 refills | Status: AC

## 2017-08-29 MED ORDER — KETOROLAC TROMETHAMINE 60 MG/2ML IM SOLN
INTRAMUSCULAR | Status: AC
  Filled 2017-08-29: qty 2

## 2017-08-29 MED ORDER — KETOROLAC TROMETHAMINE 60 MG/2ML IM SOLN
60.0000 mg | Freq: Once | INTRAMUSCULAR | Status: AC
  Administered 2017-08-29: 60 mg via INTRAMUSCULAR

## 2017-08-29 NOTE — Discharge Instructions (Addendum)
Please use the crutches as needed to help with your walking.  Continue with ice, ibuprofen for pain relief.  Please follow up with your primary care doctor if you do not improve.

## 2017-08-29 NOTE — ED Provider Notes (Signed)
[facility]    CSN: [id number] Arrival date & time: 08/29/17  1030     History   Chief Complaint Chief Complaint  Patient presents with  . Knee Pain    HPI Darryl DoffingMichael L Rice is a 27 y.o. male.   With no significant medical history, comes in today for evaluation of left knee pain for 3 days.  Patient was playing basketball 3 days ago and reports that he jumped up to catch a ball and came down wrong and immediately heard a pop in his left knee when he came down. Patient reports immediately pain 10 out of 10 localized at the left knee without radiation, numbness or tingling sensation. Pain worsen with ambulation and he is unable to bear weight. He have previously injured the same knee twice in the past due to hyperextention. He never had surgery on that knee.  He have tried Rice, ibuprofen and percocet at home without relief.      History reviewed. No pertinent past medical history.  Patient Active Problem List   Diagnosis Date Noted  . ATTENTION DEFICIT, W/HYPERACTIVITY 08/03/2006    History reviewed. No pertinent surgical history.   Home Medications    Prior to Admission medications   Not on File    Family History History reviewed. No pertinent family history.  Social History Social History   Tobacco Use  . Smoking status: Current Every Day Smoker    Packs/day: 0.50    Types: Cigarettes  . Smokeless tobacco: Never Used  Substance Use Topics  . Alcohol use: Yes    Comment: occ  . Drug use: Yes    Types: Marijuana     Allergies   Patient has no known allergies.   Review of Systems Review of Systems  Constitutional:       See HPI     Physical Exam Triage Vital Signs ED Triage Vitals  Enc Vitals Group     BP 08/29/17 1101 129/83     Pulse Rate 08/29/17 1101 77     Resp 08/29/17 1101 18     Temp 08/29/17 1101 98.2 F (36.8 C)     Temp src --      SpO2 08/29/17 1101 97 %     Weight --      Height --      Head Circumference --      Peak Flow --      Pain Score 08/29/17 1100 9     Pain Loc --      Pain Edu? --      Excl. in GC? --    No data found.  Updated Vital Signs BP 129/83   Pulse 77   Temp 98.2 F (36.8 C)   Resp 18   SpO2 97%   Visual Acuity Right Eye Distance:   Left Eye Distance:   Bilateral Distance:    Right Eye Near:   Left Eye Near:    Bilateral Near:     Physical Exam  Constitutional: He is oriented to person, place, and time. He appears well-developed and well-nourished.  Cardiovascular: Normal rate and regular rhythm.  Pulmonary/Chest: Effort normal and breath sounds normal. He has no wheezes.  Musculoskeletal:  Left knee symmetrical compared to right knee. No swelling, no deformity and no bruising. No pain to palpate. But has severe pain with range of motion. ROM is limited due to pain.   Neurological: He is alert and oriented to person, place, and time.  Skin: Skin  is warm and dry.  Nursing note and vitals reviewed.    UC Treatments / Results  Labs (all labs ordered are listed, but only abnormal results are displayed) Labs Reviewed - No data to display  EKG None Radiology Dg Knee Complete 4 Views Left  Result Date: 08/29/2017 CLINICAL DATA:  Patient ports falling 3 days ago while playing basketball with resultant left knee injury. The patient reports pain with weight-bearing. EXAM: LEFT KNEE - COMPLETE 4+ VIEW COMPARISON:  Left knee series of February 03, 2012 FINDINGS: The bones are subjectively adequately mineralized. There is mild narrowing of the medial joint compartment. There is no significant osteophyte formation. There is no acute fracture nor dislocation. There is a moderate-sized suprapatellar effusion. IMPRESSION: Moderate-sized suprapatellar effusion. Mild narrowing of the medial joint compartment slightly more conspicuous than on the previous study. This may reflect early osteoarthritic change. No acute fracture or dislocation. Electronically Signed   By: David   [place] M.D.   On: 08/29/2017 11:42    Procedures Procedures (including critical care time)  Medications Ordered in UC Medications  ketorolac (TORADOL) injection 60 mg (60 mg Intramuscular Given 08/29/17 1135)   Initial Impression / Assessment and Plan / UC Course  I have reviewed the triage vital signs and the nursing notes.  Pertinent labs & imaging results that were available during my care of the patient were reviewed by me and considered in my medical decision making (see chart for details).  Final Clinical Impressions(s) / UC Diagnoses   Final diagnoses:  Acute pain of left knee   X-rays of the left knee shows no acute fracture or dislocation.  X-ray shows moderate suprapatellar effusion and potential early osteoarthritic change.  She is informed of his x-ray results.  Advised to continue Rice therapy at home.  Knee brace and crutches given per patient request to help with his ambulation. Advised that he may return if doesn't improve or he may follow up with his PCP.   ED Discharge Orders    None     Controlled Substance Prescriptions [place] Controlled Substance Registry consulted? Yes   Lucia Estelle, NP 08/29/17 1154

## 2017-08-29 NOTE — ED Triage Notes (Signed)
Pt here for left knee pain since hyperextending it on Sunday while playing basketball. Reports that he has injured this knee a few years back. To touch his knee is doesn't hurts but bearing weight hurts. He has been taking ibuprofen, percocet, and muscle relaxer without relief. He has also been RICE.

## 2018-03-27 ENCOUNTER — Ambulatory Visit (INDEPENDENT_AMBULATORY_CARE_PROVIDER_SITE_OTHER): Payer: Self-pay

## 2018-03-27 ENCOUNTER — Encounter (HOSPITAL_COMMUNITY): Payer: Self-pay | Admitting: *Deleted

## 2018-03-27 ENCOUNTER — Ambulatory Visit (HOSPITAL_COMMUNITY)
Admission: EM | Admit: 2018-03-27 | Discharge: 2018-03-27 | Disposition: A | Discharge status: Home / Self Care | Payer: Self-pay | Attending: Family Medicine | Admitting: Family Medicine

## 2018-03-27 ENCOUNTER — Other Ambulatory Visit: Payer: Self-pay

## 2018-03-27 DIAGNOSIS — S62334A Displaced fracture of neck of fourth metacarpal bone, right hand, initial encounter for closed fracture: Secondary | ICD-10-CM

## 2018-03-27 DIAGNOSIS — W230XXA Caught, crushed, jammed, or pinched between moving objects, initial encounter: Secondary | ICD-10-CM

## 2018-03-27 MED ORDER — KETOROLAC TROMETHAMINE 30 MG/ML IJ SOLN
INTRAMUSCULAR | Status: AC
  Filled 2018-03-27: qty 1

## 2018-03-27 MED ORDER — HYDROCODONE-ACETAMINOPHEN 5-325 MG PO TABS
2.0000 | ORAL_TABLET | Freq: Once | ORAL | Status: AC
  Administered 2018-03-27: 2 via ORAL

## 2018-03-27 MED ORDER — KETOROLAC TROMETHAMINE 30 MG/ML IJ SOLN
30.0000 mg | Freq: Once | INTRAMUSCULAR | Status: AC
  Administered 2018-03-27: 30 mg via INTRAMUSCULAR

## 2018-03-27 MED ORDER — HYDROCODONE-ACETAMINOPHEN 5-325 MG PO TABS: 1.0000 | ORAL_TABLET | Freq: Four times a day (QID) | ORAL | 0 refills | Status: DC | PRN

## 2018-03-27 MED ORDER — HYDROCODONE-ACETAMINOPHEN 5-325 MG PO TABS
ORAL_TABLET | ORAL | Status: AC
  Filled 2018-03-27: qty 2

## 2018-03-27 MED ORDER — MELOXICAM 7.5 MG PO TABS: 7.5000 mg | ORAL_TABLET | Freq: Every day | ORAL | 0 refills | Status: DC

## 2018-03-27 NOTE — ED Provider Notes (Addendum)
[facility]    CSN: [id number] Arrival date & time: 03/27/18  1034     History   Chief Complaint Chief Complaint  Patient presents with  . Hand Pain    HPI Darryl Rice is a 27 y.o. male.   27 year old male who is right hand dominant comes in for right hand pain and swelling after injury. States a car door closed onto his right hand. He has diffuse swelling to the hand with bruising to the right fourth finger. Decreased ROM. Took 2 doses of ibuprofen 800mg  with mild relief.      History reviewed. No pertinent past medical history.  Patient Active Problem List   Diagnosis Date Noted  . ATTENTION DEFICIT, W/HYPERACTIVITY 08/03/2006    History reviewed. No pertinent surgical history.     Home Medications    Prior to Admission medications   Medication Sig Start Date End Date Taking? Authorizing Provider  HYDROcodone-acetaminophen (NORCO/VICODIN) 5-325 MG tablet Take 1 tablet by mouth every 6 (six) hours as needed for severe pain. 03/27/18   Cathie Hoops, Setsuko Robins V, PA-C  meloxicam (MOBIC) 7.5 MG tablet Take 1 tablet (7.5 mg total) by mouth daily. 03/27/18   Belinda Fisher, PA-C    Family History No family history on file.  Social History Social History   Tobacco Use  . Smoking status: Current Every Day Smoker    Packs/day: 0.50    Types: Cigarettes  . Smokeless tobacco: Never Used  Substance Use Topics  . Alcohol use: Yes    Comment: occ  . Drug use: Yes    Types: Marijuana     Allergies   Patient has no known allergies.   Review of Systems Review of Systems  Reason unable to perform ROS: See HPI as above.     Physical Exam Triage Vital Signs ED Triage Vitals  Enc Vitals Group     BP 03/27/18 1048 127/86     Pulse Rate 03/27/18 1048 78     Resp 03/27/18 1048 16     Temp 03/27/18 1048 97.8 F (36.6 C)     Temp Source 03/27/18 1048 Oral     SpO2 03/27/18 1048 96 %     Weight --      Height --      Head Circumference --      Peak Flow --       Pain Score 03/27/18 1049 9     Pain Loc --      Pain Edu? --      Excl. in GC? --    No data found.  Updated Vital Signs BP 127/86 (BP Location: Left Arm)   Pulse 78   Temp 97.8 F (36.6 C) (Oral)   Resp 16   SpO2 96%   Physical Exam  Constitutional: He is oriented to person, place, and time. He appears well-developed and well-nourished. No distress.  HENT:  Head: Normocephalic and atraumatic.  Eyes: Pupils are equal, round, and reactive to light. Conjunctivae are normal.  Musculoskeletal:  Diffuse swelling of dorsal right hand with contusion to the palmar fourth distal MCP.  Tenderness to palpation diffusely of the MCPs, and along fourth digit.  Decreased range of motion.  Strength deferred.  Sensation intact and equal bilaterally.  Radial pulse 2+ and equal bilaterally.  Cap refill less than 2 seconds.  Neurological: He is alert and oriented to person, place, and time.  Skin: He is not diaphoretic.   UC Treatments / Results  Labs (all labs ordered are listed, but only abnormal results are displayed) Labs Reviewed - No data to display  EKG None  Radiology Dg Hand Complete Right  Result Date: 03/27/2018 CLINICAL DATA:  Slammed hand in car door yesterday. EXAM: RIGHT HAND - COMPLETE 3+ VIEW COMPARISON:  None. FINDINGS: The mineralization and alignment are normal. There is deformity of the 4th metacarpal shaft with apex dorsal bowing consistent with an old fracture. There is a probable acute fracture involving the 4th metacarpal neck with associated apical dorsal bone, best seen on the oblique view. No other evidence of acute fracture or dislocation. Probable old ununited ulnar styloid fracture. There is soft tissue swelling throughout the hand. IMPRESSION: Probable acute fracture of the 4th metacarpal neck superimposed on underlying chronic deformity from old injury. Correlate with area of point tenderness. Electronically Signed   By: Carey Bullocks M.D.   On: 03/27/2018  11:13    Procedures Procedures (including critical care time)  Medications Ordered in UC Medications  HYDROcodone-acetaminophen (NORCO/VICODIN) 5-325 MG per tablet 2 tablet (has no administration in time range)  ketorolac (TORADOL) 30 MG/ML injection 30 mg (30 mg Intramuscular Given 03/27/18 1130)    Initial Impression / Assessment and Plan / UC Course  I have reviewed the triage vital signs and the nursing notes.  Pertinent labs & imaging results that were available during my care of the patient were reviewed by me and considered in my medical decision making (see chart for details).    Discussed x-ray results with patient.  Patient without known prior injury to the hand with diffuse tenderness of the MCPs.  Will treat as acute fracture and follow up with orthopedics. Ulnar guttar splint applied. Mobic for pain, norco for breakthrough pain. Ice compress, elevation. Follow up with orthopedics for further evaluation and management needed.  Patient requesting additional pain relief, states toradol was not affected. Norco in office.  Final Clinical Impressions(s) / UC Diagnoses   Final diagnoses:  Closed displaced fracture of neck of fourth metacarpal bone of right hand, initial encounter    ED Prescriptions    Medication Sig Dispense Auth. Provider   meloxicam (MOBIC) 7.5 MG tablet Take 1 tablet (7.5 mg total) by mouth daily. 15 tablet Merl Bommarito V, PA-C   HYDROcodone-acetaminophen (NORCO/VICODIN) 5-325 MG tablet Take 1 tablet by mouth every 6 (six) hours as needed for severe pain. 10 tablet Belinda Fisher, PA-C     Controlled Substance Prescriptions [place] Controlled Substance Registry consulted? Yes, I have consulted the [place] Controlled Substances Registry for this patient, and feel the risk/benefit ratio today is favorable for proceeding with this prescription for a controlled substance.   Belinda Fisher, PA-C 03/27/18 1133    Linward Headland V, PA-C 03/27/18 1230
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
## 2018-03-27 NOTE — Progress Notes (Signed)
Orthopedic Tech Progress Note Patient Details:  Darryl Rice [birth year] [id number]  Ortho Devices Type of Ortho Device: Ace wrap, Ulna gutter splint Ortho Device/Splint Location: rue Ortho Device/Splint Interventions: Application   Post Interventions Patient Tolerated: Well Instructions Provided: Care of device   [name] 03/27/2018, 12:40 PM

## 2018-03-27 NOTE — ED Notes (Signed)
Waiting for Ortho Tech to arrive.

## 2018-03-27 NOTE — Discharge Instructions (Signed)
Splint applied today.  Start Mobic as directed for pain. Do not take ibuprofen (motrin/advil)/ naproxen (aleve) while on mobic.  He can take Norco for breakthrough pain.  Ice compress, elevation.  Please follow-up with hand orthopedics for further evaluation and management needed.

## 2018-04-05 ENCOUNTER — Emergency Department (HOSPITAL_COMMUNITY)
Admission: EM | Admit: 2018-04-05 | Discharge: 2018-04-05 | Discharge status: Against Medical Advice | Payer: Self-pay | Attending: Emergency Medicine | Admitting: Emergency Medicine

## 2018-04-05 DIAGNOSIS — R29898 Other symptoms and signs involving the musculoskeletal system: Secondary | ICD-10-CM

## 2018-04-05 NOTE — ED Notes (Signed)
No answer for triage x2 

## 2018-04-05 NOTE — ED Notes (Signed)
No answer for triage x3 

## 2018-04-07 ENCOUNTER — Encounter (HOSPITAL_COMMUNITY): Payer: Self-pay

## 2018-04-07 ENCOUNTER — Other Ambulatory Visit: Payer: Self-pay

## 2018-04-07 ENCOUNTER — Emergency Department (HOSPITAL_COMMUNITY): Payer: Self-pay

## 2018-04-07 ENCOUNTER — Emergency Department (HOSPITAL_COMMUNITY)
Admission: EM | Admit: 2018-04-07 | Discharge: 2018-04-08 | Disposition: A | Discharge status: Home / Self Care | Payer: Self-pay | Attending: Emergency Medicine | Admitting: Emergency Medicine

## 2018-04-07 DIAGNOSIS — S62334A Displaced fracture of neck of fourth metacarpal bone, right hand, initial encounter for closed fracture: Secondary | ICD-10-CM | POA: Insufficient documentation

## 2018-04-07 DIAGNOSIS — Y929 Unspecified place or not applicable: Secondary | ICD-10-CM | POA: Insufficient documentation

## 2018-04-07 DIAGNOSIS — Y9389 Activity, other specified: Secondary | ICD-10-CM | POA: Insufficient documentation

## 2018-04-07 DIAGNOSIS — F17210 Nicotine dependence, cigarettes, uncomplicated: Secondary | ICD-10-CM | POA: Insufficient documentation

## 2018-04-07 DIAGNOSIS — M25532 Pain in left wrist: Secondary | ICD-10-CM | POA: Insufficient documentation

## 2018-04-07 DIAGNOSIS — S62334D Displaced fracture of neck of fourth metacarpal bone, right hand, subsequent encounter for fracture with routine healing: Secondary | ICD-10-CM

## 2018-04-07 DIAGNOSIS — Y999 Unspecified external cause status: Secondary | ICD-10-CM | POA: Insufficient documentation

## 2018-04-07 MED ORDER — IBUPROFEN 800 MG PO TABS
800.0000 mg | ORAL_TABLET | Freq: Once | ORAL | Status: AC
  Administered 2018-04-08: 800 mg via ORAL
  Filled 2018-04-07: qty 1
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
## 2018-04-07 NOTE — ED Notes (Signed)
Patient transported to X-ray 
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
## 2018-04-08 NOTE — Discharge Instructions (Addendum)
1. Medications: alternate ibuprofen (800mg  3x per fay) and tylenol (1000mg  4x per day - do not exceed 4g in 24 hours) for pain control, usual home medications 2. Treatment: rest, ice, elevate and use brace, drink plenty of fluids, gentle stretching 3. Follow Up: Please followup with orthopedics as directed or your PCP in 1 week if no improvement for discussion of your diagnoses and further evaluation after today's visit; if you do not have a primary care doctor use the resource guide provided to find one; Please return to the ER for worsening symptoms or other concerns

## 2018-04-12 ENCOUNTER — Other Ambulatory Visit: Payer: Self-pay | Admitting: Orthopedic Surgery

## 2018-04-12 ENCOUNTER — Other Ambulatory Visit: Payer: Self-pay

## 2018-04-12 ENCOUNTER — Encounter (HOSPITAL_COMMUNITY): Payer: Self-pay | Admitting: *Deleted

## 2018-04-13 ENCOUNTER — Ambulatory Visit (HOSPITAL_COMMUNITY): Payer: Self-pay | Admitting: Anesthesiology

## 2018-04-13 ENCOUNTER — Encounter (HOSPITAL_COMMUNITY)
Admission: RE | Disposition: A | Discharge status: Home / Self Care | Payer: Self-pay | Source: Ambulatory Visit | Attending: Orthopedic Surgery

## 2018-04-13 ENCOUNTER — Ambulatory Visit (HOSPITAL_COMMUNITY)
Admission: RE | Admit: 2018-04-13 | Discharge: 2018-04-13 | Disposition: A | Discharge status: Home / Self Care | Payer: Self-pay | Source: Ambulatory Visit | Attending: Orthopedic Surgery | Admitting: Orthopedic Surgery

## 2018-04-13 DIAGNOSIS — I10 Essential (primary) hypertension: Secondary | ICD-10-CM | POA: Insufficient documentation

## 2018-04-13 DIAGNOSIS — X58XXXA Exposure to other specified factors, initial encounter: Secondary | ICD-10-CM | POA: Insufficient documentation

## 2018-04-13 DIAGNOSIS — S62304A Unspecified fracture of fourth metacarpal bone, right hand, initial encounter for closed fracture: Secondary | ICD-10-CM | POA: Insufficient documentation

## 2018-04-13 DIAGNOSIS — Z6841 Body Mass Index (BMI) 40.0 and over, adult: Secondary | ICD-10-CM | POA: Insufficient documentation

## 2018-04-13 DIAGNOSIS — F17210 Nicotine dependence, cigarettes, uncomplicated: Secondary | ICD-10-CM | POA: Insufficient documentation

## 2018-04-13 HISTORY — DX: Pneumonia, unspecified organism: J18.9

## 2018-04-13 HISTORY — PX: OPEN REDUCTION INTERNAL FIXATION (ORIF) METACARPAL: SHX6234

## 2018-04-13 LAB — BASIC METABOLIC PANEL
ANION GAP: 6 (ref 5–15)
BUN: 16 mg/dL (ref 6–20)
CHLORIDE: 106 mmol/L (ref 98–111)
CO2: 24 mmol/L (ref 22–32)
Calcium: 9 mg/dL (ref 8.9–10.3)
Creatinine, Ser: 1.41 mg/dL — ABNORMAL HIGH (ref 0.61–1.24)
GFR calc Af Amer: >60 mL/min (ref >60)
GFR calc non Af Amer: >60 mL/min (ref >60)
GLUCOSE: 93 mg/dL (ref 70–99)
POTASSIUM: 3.9 mmol/L (ref 3.5–5.1)
Sodium: 136 mmol/L (ref 135–145)

## 2018-04-13 LAB — CBC
HCT: 43.5 % (ref 39.0–52.0)
HEMOGLOBIN: 13.2 g/dL (ref 13.0–17.0)
MCH: 24.4 pg — AB (ref 26.0–34.0)
MCHC: 30.3 g/dL (ref 30.0–36.0)
MCV: 80.3 fL (ref 80.0–100.0)
Platelets: 197 10*3/uL (ref 150–400)
RBC: 5.42 MIL/uL (ref 4.22–5.81)
RDW: 13.8 % (ref 11.5–15.5)
WBC: 7.1 10*3/uL (ref 4.0–10.5)
nRBC: 0 % (ref 0.0–0.2)

## 2018-04-13 SURGERY — OPEN REDUCTION INTERNAL FIXATION (ORIF) METACARPAL
Anesthesia: General | Laterality: Right

## 2018-04-13 MED ORDER — FENTANYL CITRATE (PF) 250 MCG/5ML IJ SOLN
INTRAMUSCULAR | Status: DC | PRN
  Administered 2018-04-13 (×2): 100 ug via INTRAVENOUS
  Administered 2018-04-13: 50 ug via INTRAVENOUS

## 2018-04-13 MED ORDER — FENTANYL CITRATE (PF) 100 MCG/2ML IJ SOLN
INTRAMUSCULAR | Status: AC
  Filled 2018-04-13: qty 2

## 2018-04-13 MED ORDER — FENTANYL CITRATE (PF) 250 MCG/5ML IJ SOLN
INTRAMUSCULAR | Status: AC
  Filled 2018-04-13: qty 5

## 2018-04-13 MED ORDER — OXYCODONE-ACETAMINOPHEN 5-325 MG PO TABS: 1.0000 | ORAL_TABLET | Freq: Once | ORAL | Status: DC

## 2018-04-13 MED ORDER — MIDAZOLAM HCL 2 MG/2ML IJ SOLN
INTRAMUSCULAR | Status: AC
  Filled 2018-04-13: qty 2

## 2018-04-13 MED ORDER — PHENYLEPHRINE 40 MCG/ML (10ML) SYRINGE FOR IV PUSH (FOR BLOOD PRESSURE SUPPORT)
PREFILLED_SYRINGE | INTRAVENOUS | Status: DC | PRN
  Administered 2018-04-13 (×2): 80 ug via INTRAVENOUS

## 2018-04-13 MED ORDER — DEXAMETHASONE SODIUM PHOSPHATE 10 MG/ML IJ SOLN
INTRAMUSCULAR | Status: DC | PRN
  Administered 2018-04-13: 10 mg via INTRAVENOUS

## 2018-04-13 MED ORDER — PROPOFOL 10 MG/ML IV BOLUS
INTRAVENOUS | Status: AC
  Filled 2018-04-13: qty 40

## 2018-04-13 MED ORDER — OXYCODONE HCL 5 MG PO TABS
5.0000 mg | ORAL_TABLET | Freq: Once | ORAL | Status: AC | PRN
  Administered 2018-04-13: 5 mg via ORAL

## 2018-04-13 MED ORDER — ONDANSETRON HCL 4 MG/2ML IJ SOLN
INTRAMUSCULAR | Status: AC
  Filled 2018-04-13: qty 2

## 2018-04-13 MED ORDER — DEXAMETHASONE SODIUM PHOSPHATE 10 MG/ML IJ SOLN
INTRAMUSCULAR | Status: AC
  Filled 2018-04-13: qty 1

## 2018-04-13 MED ORDER — OXYCODONE-ACETAMINOPHEN 5-325 MG PO TABS
ORAL_TABLET | ORAL | Status: AC
  Administered 2018-04-13: 1
  Filled 2018-04-13: qty 1

## 2018-04-13 MED ORDER — FENTANYL CITRATE (PF) 100 MCG/2ML IJ SOLN
25.0000 ug | INTRAMUSCULAR | Status: DC | PRN
  Administered 2018-04-13: 100 ug via INTRAVENOUS

## 2018-04-13 MED ORDER — OXYCODONE-ACETAMINOPHEN 5-325 MG PO TABS: 1.0000 | ORAL_TABLET | ORAL | 0 refills | Status: AC | PRN

## 2018-04-13 MED ORDER — LACTATED RINGERS IV SOLN
INTRAVENOUS | Status: DC
  Administered 2018-04-13 (×2): via INTRAVENOUS

## 2018-04-13 MED ORDER — ROCURONIUM BROMIDE 50 MG/5ML IV SOSY
PREFILLED_SYRINGE | INTRAVENOUS | Status: AC
  Filled 2018-04-13: qty 5

## 2018-04-13 MED ORDER — LIDOCAINE 2% (20 MG/ML) 5 ML SYRINGE
INTRAMUSCULAR | Status: DC | PRN
  Administered 2018-04-13: 100 mg via INTRAVENOUS

## 2018-04-13 MED ORDER — PROPOFOL 10 MG/ML IV BOLUS
INTRAVENOUS | Status: DC | PRN
  Administered 2018-04-13: 300 mg via INTRAVENOUS

## 2018-04-13 MED ORDER — BUPIVACAINE HCL (PF) 0.25 % IJ SOLN
INTRAMUSCULAR | Status: DC | PRN
  Administered 2018-04-13: 10 mL

## 2018-04-13 MED ORDER — DEXTROSE 5 % IV SOLN
3.0000 g | INTRAVENOUS | Status: AC
  Administered 2018-04-13: 3 g via INTRAVENOUS
  Filled 2018-04-13: qty 3

## 2018-04-13 MED ORDER — ONDANSETRON HCL 4 MG/2ML IJ SOLN
INTRAMUSCULAR | Status: DC | PRN
  Administered 2018-04-13: 4 mg via INTRAVENOUS

## 2018-04-13 MED ORDER — OXYCODONE HCL 5 MG PO TABS
ORAL_TABLET | ORAL | Status: AC
  Filled 2018-04-13: qty 1

## 2018-04-13 MED ORDER — EPHEDRINE SULFATE-NACL 50-0.9 MG/10ML-% IV SOSY
PREFILLED_SYRINGE | INTRAVENOUS | Status: DC | PRN
  Administered 2018-04-13: 10 mg via INTRAVENOUS
  Administered 2018-04-13: 15 mg via INTRAVENOUS
  Administered 2018-04-13: 10 mg via INTRAVENOUS
  Administered 2018-04-13: 15 mg via INTRAVENOUS
  Administered 2018-04-13: 5 mg via INTRAVENOUS

## 2018-04-13 MED ORDER — BUPIVACAINE HCL (PF) 0.25 % IJ SOLN
INTRAMUSCULAR | Status: AC
  Filled 2018-04-13: qty 10

## 2018-04-13 MED ORDER — CHLORHEXIDINE GLUCONATE 4 % EX LIQD: 60.0000 mL | Freq: Once | CUTANEOUS | Status: DC

## 2018-04-13 MED ORDER — BUPIVACAINE HCL (PF) 0.75 % IJ SOLN
INTRAMUSCULAR | Status: AC
  Filled 2018-04-13: qty 30

## 2018-04-13 MED ORDER — ONDANSETRON HCL 4 MG/2ML IJ SOLN: 4.0000 mg | Freq: Once | INTRAMUSCULAR | Status: DC | PRN

## 2018-04-13 MED ORDER — MIDAZOLAM HCL 2 MG/2ML IJ SOLN
INTRAMUSCULAR | Status: DC | PRN
  Administered 2018-04-13: 2 mg via INTRAVENOUS

## 2018-04-13 MED ORDER — DEXMEDETOMIDINE HCL IN NACL 200 MCG/50ML IV SOLN
INTRAVENOUS | Status: DC | PRN
  Administered 2018-04-13: 4 ug via INTRAVENOUS
  Administered 2018-04-13 (×2): 8 ug via INTRAVENOUS
  Administered 2018-04-13: 4 ug via INTRAVENOUS

## 2018-04-13 MED ORDER — CEFAZOLIN SODIUM-DEXTROSE 2-4 GM/100ML-% IV SOLN
INTRAVENOUS | Status: AC
  Filled 2018-04-13: qty 100

## 2018-04-13 MED ORDER — OXYCODONE HCL 5 MG/5ML PO SOLN: 5.0000 mg | Freq: Once | ORAL | Status: AC | PRN

## 2018-04-13 MED ORDER — LIDOCAINE 2% (20 MG/ML) 5 ML SYRINGE
INTRAMUSCULAR | Status: AC
  Filled 2018-04-13: qty 5

## 2018-04-13 SURGICAL SUPPLY — 58 items
APL SKNCLS STERI-STRIP NONHPOA (GAUZE/BANDAGES/DRESSINGS) ×1
BANDAGE ACE 3X5.8 VEL STRL LF (GAUZE/BANDAGES/DRESSINGS) ×3 IMPLANT
BANDAGE ACE 4X5 VEL STRL LF (GAUZE/BANDAGES/DRESSINGS) ×3 IMPLANT
BENZOIN TINCTURE PRP APPL 2/3 (GAUZE/BANDAGES/DRESSINGS) ×2 IMPLANT
BIT DRILL CANN 2.4 (BIT) ×2
BIT DRILL CANN 2.4MM (BIT) ×1
BIT DRILL CANN MAX VPC 2.4 (BIT) IMPLANT
BNDG CMPR 9X4 STRL LF SNTH (GAUZE/BANDAGES/DRESSINGS) ×1
BNDG ESMARK 4X9 LF (GAUZE/BANDAGES/DRESSINGS) ×3 IMPLANT
BNDG GAUZE ELAST 4 BULKY (GAUZE/BANDAGES/DRESSINGS) ×6 IMPLANT
CLOSURE STERI-STRIP 1/4X4 (GAUZE/BANDAGES/DRESSINGS) ×2 IMPLANT
CORDS BIPOLAR (ELECTRODE) IMPLANT
COVER SURGICAL LIGHT HANDLE (MISCELLANEOUS) ×3 IMPLANT
COVER WAND RF STERILE (DRAPES) ×3 IMPLANT
CUFF TOURNIQUET SINGLE 18IN (TOURNIQUET CUFF) ×3 IMPLANT
CUFF TOURNIQUET SINGLE 24IN (TOURNIQUET CUFF) IMPLANT
DRAPE OEC MINIVIEW 54X84 (DRAPES) IMPLANT
DRAPE SURG 17X23 STRL (DRAPES) ×3 IMPLANT
DURAPREP 26ML APPLICATOR (WOUND CARE) ×3 IMPLANT
ELECT REM PT RETURN 9FT ADLT (ELECTROSURGICAL)
ELECTRODE REM PT RTRN 9FT ADLT (ELECTROSURGICAL) IMPLANT
GAUZE SPONGE 4X4 12PLY STRL (GAUZE/BANDAGES/DRESSINGS) ×3 IMPLANT
GAUZE XEROFORM 1X8 LF (GAUZE/BANDAGES/DRESSINGS) ×3 IMPLANT
GLOVE SURG SYN 8.0 (GLOVE) ×3 IMPLANT
GLOVE SURG SYN 8.0 PF PI (GLOVE) ×1 IMPLANT
GOWN STRL REUS W/ TWL LRG LVL3 (GOWN DISPOSABLE) ×1 IMPLANT
GOWN STRL REUS W/ TWL XL LVL3 (GOWN DISPOSABLE) ×1 IMPLANT
GOWN STRL REUS W/TWL LRG LVL3 (GOWN DISPOSABLE) ×3
GOWN STRL REUS W/TWL XL LVL3 (GOWN DISPOSABLE) ×3
K-WIRE COCR 1.1X105 (WIRE) ×3
KIT BASIN OR (CUSTOM PROCEDURE TRAY) ×3 IMPLANT
KIT TURNOVER KIT B (KITS) ×3 IMPLANT
KWIRE COCR 1.1X105 (WIRE) IMPLANT
MANIFOLD NEPTUNE II (INSTRUMENTS) ×3 IMPLANT
NDL HYPO 25GX1X1/2 BEV (NEEDLE) IMPLANT
NDL HYPO 25X1 1.5 SAFETY (NEEDLE) IMPLANT
NEEDLE HYPO 25GX1X1/2 BEV (NEEDLE) IMPLANT
NEEDLE HYPO 25X1 1.5 SAFETY (NEEDLE) IMPLANT
NS IRRIG 1000ML POUR BTL (IV SOLUTION) ×3 IMPLANT
PACK ORTHO EXTREMITY (CUSTOM PROCEDURE TRAY) ×3 IMPLANT
PAD ARMBOARD 7.5X6 YLW CONV (MISCELLANEOUS) ×6 IMPLANT
PAD CAST 3X4 CTTN HI CHSV (CAST SUPPLIES) ×1 IMPLANT
PAD CAST 4YDX4 CTTN HI CHSV (CAST SUPPLIES) ×1 IMPLANT
PADDING CAST COTTON 3X4 STRL (CAST SUPPLIES) ×3
PADDING CAST COTTON 4X4 STRL (CAST SUPPLIES) ×3
PENCIL BUTTON HOLSTER BLD 10FT (ELECTRODE) IMPLANT
SCREW VPC 3.4X28 (Screw) ×2 IMPLANT
SPONGE LAP 4X18 RFD (DISPOSABLE) ×6 IMPLANT
SUT PROLENE 3 0 PS 2 (SUTURE) IMPLANT
SUT VIC AB 3-0 FS2 27 (SUTURE) IMPLANT
SUT VICRYL 4-0 PS2 18IN ABS (SUTURE) IMPLANT
SYR CONTROL 10ML LL (SYRINGE) IMPLANT
TOWEL OR 17X24 6PK STRL BLUE (TOWEL DISPOSABLE) ×3 IMPLANT
TOWEL OR 17X26 10 PK STRL BLUE (TOWEL DISPOSABLE) ×3 IMPLANT
TUBE CONNECTING 12'X1/4 (SUCTIONS)
TUBE CONNECTING 12X1/4 (SUCTIONS) IMPLANT
UNDERPAD 30X30 (UNDERPADS AND DIAPERS) ×3 IMPLANT
WATER STERILE IRR 1000ML POUR (IV SOLUTION) ×3 IMPLANT

## 2018-04-13 NOTE — Op Note (Signed)
Please see dictated report [phone]

## 2018-04-13 NOTE — Anesthesia Procedure Notes (Signed)
Procedure Name: LMA Insertion Date/Time: 04/13/2018 12:05 PM Performed by: Adria Dill, CRNA Pre-anesthesia Checklist: Patient identified, Emergency Drugs available, Suction available and Patient being monitored Patient Re-evaluated:Patient Re-evaluated prior to induction Oxygen Delivery Method: Circle system utilized Preoxygenation: Pre-oxygenation with 100% oxygen Induction Type: IV induction LMA: LMA inserted LMA Size: 5.0 Number of attempts: 1 Placement Confirmation: positive ETCO2,  CO2 detector and breath sounds checked- equal and bilateral Tube secured with: Tape Dental Injury: Teeth and Oropharynx as per pre-operative assessment

## 2018-04-13 NOTE — Transfer of Care (Signed)
Immediate Anesthesia Transfer of Care Note  Patient: Darryl Rice  Procedure(s) Performed: OPEN REDUCTION INTERNAL FIXATION (ORIF) METACARPAL (Right )  Patient Location: PACU  Anesthesia Type:General  Level of Consciousness: awake, drowsy and patient cooperative  Airway & Oxygen Therapy: Patient Spontanous Breathing and Patient connected to nasal cannula oxygen  Post-op Assessment: Report given to RN and Post -op Vital signs reviewed and stable  Post vital signs: Reviewed and stable  Last Vitals:  Vitals Value Taken Time  BP    Temp    Pulse 115 04/13/2018  1:13 PM  Resp 19 04/13/2018  1:13 PM  SpO2 98 % 04/13/2018  1:13 PM  Vitals shown include unvalidated device data.  Last Pain:  Vitals:   04/13/18 0937  TempSrc: Oral         Complications: No apparent anesthesia complications

## 2018-04-13 NOTE — Op Note (Signed)
NAME: Darryl Rice, Darryl Rice MEDICAL RECORD [id number] ACCOUNT [id number] DATE OF BIRTH:1990/10/10 FACILITY: [facility] LOCATION: [facility] PHYSICIAN:Winnifred Dufford A. Mina Marble, MD  OPERATIVE REPORT  DATE OF PROCEDURE:  04/13/2018  PREOPERATIVE DIAGNOSIS:  Impending malunion, right ring metacarpal fracture, intra-articular.  POSTOPERATIVE DIAGNOSIS:  Impending malunion, right ring metacarpal fracture, intraarticular.  PROCEDURE:  Open reduction internal fixation of above.  SURGEON:  Dairl Ponder, MD  ASSISTANT:  None.  ANESTHESIA:  General.  COMPLICATIONS:  No complications.  DRAINS:  No drains.  DESCRIPTION OF PROCEDURE:  The patient was taken to the operating suite after induction of adequate general anesthetic.  Right upper extremity was prepped and draped in the usual sterile fashion.  An Esmarch was used to exsanguinate the limb.  The  tourniquet was inflated to 250 mmHg.  At this point in time, incision was made over the ulnar side of the ring metacarpal over the metacarpophalangeal joint.  The skin was incised.  The extensor mechanism was retracted to the radial side.  We entered the  metacarpophalangeal joint and dissected the distal aspect of the ring metacarpal revealing an impending malunion of the distal aspect with slight intra-articular extension and deviation to the volar radial side.  A Freer elevator was used to identify  the fracture line and callus which was debrided with a rongeur.  After the nonunion was debrided, longitudinal traction and a reduction clamp were used to reduce the fracture.  We then placed a single 3.4 mm x 28 mm Biomet head from distal to proximal  across the fracture site under direct and fluoroscopic guidance.  The fracture was stable.  The wound was irrigated and loosely closed in layers of 4-0 Vicryl and 3-0 Prolene subcuticular stitch on the skin.  Steri-Strips, 4 x 4 fluffs and an ulnar  gutter splint was applied.    The patient tolerated this  procedure well and went to recovery room in stable fashion.  AN/NUANCE  D:04/13/2018 T:04/13/2018 JOB:[id number]

## 2018-04-13 NOTE — H&P (Signed)
Darryl Rice is an 27 y.o. male.   Chief Complaint: Right hand pain, swelling, and deformity HPI: Patient is a very pleasant 27 year old right-hand-dominant male with a displaced right ring finger metacarpal fracture with pain, swelling, and deformity  Past Medical History:  Diagnosis Date  . Pneumonia     Past Surgical History:  Procedure Laterality Date  . NO PAST SURGERIES      No family history on file. Social History:  reports that he has been smoking cigarettes. He has been smoking about 0.50 packs per day. He has never used smokeless tobacco. He reports that he drinks alcohol. He reports that he has current or past drug history. Drug: Marijuana.  Allergies: No Known Allergies  Medications Prior to Admission  Medication Sig Dispense Refill  . acetaminophen (TYLENOL) 500 MG tablet Take 1,000 mg by mouth every 6 (six) hours as needed (for pain.).    Marland Kitchen HYDROcodone-acetaminophen (NORCO/VICODIN) 5-325 MG tablet Take 1 tablet by mouth every 6 (six) hours as needed for severe pain. (Patient not taking: Reported on 04/12/2018) 10 tablet 0  . meloxicam (MOBIC) 7.5 MG tablet Take 1 tablet (7.5 mg total) by mouth daily. (Patient not taking: Reported on 04/12/2018) 15 tablet 0    Results for orders placed or performed during the hospital encounter of 04/13/18 (from the past 48 hour(s))  CBC     Status: Abnormal   Collection Time: 04/13/18 10:14 AM  Result Value Ref Range   WBC 7.1 4.0 - 10.5 K/uL   RBC 5.42 4.22 - 5.81 MIL/uL   Hemoglobin 13.2 13.0 - 17.0 g/dL   HCT 16.1 09.6 - 04.5 %   MCV 80.3 80.0 - 100.0 fL   MCH 24.4 (L) 26.0 - 34.0 pg   MCHC 30.3 30.0 - 36.0 g/dL   RDW 40.9 81.1 - 91.4 %   Platelets 197 150 - 400 K/uL   nRBC 0.0 0.0 - 0.2 %    Comment: Performed at [facility], [address], [place], [place] [address]   No results found.  Review of Systems  All other systems reviewed and are negative.   Blood pressure (!) 177/80, pulse 67, temperature 97.9  F (36.6 C), temperature source Oral, resp. rate 18, height 6\' 1"  (1.854 m), weight (!) 143.3 kg, SpO2 100 %. Physical Exam  Constitutional: He is oriented to person, place, and time. He appears well-developed and well-nourished.  HENT:  Head: Normocephalic and atraumatic.  Neck: Normal range of motion.  Cardiovascular: Normal rate.  Respiratory: Effort normal.  Musculoskeletal:       Right hand: He exhibits bony tenderness, deformity and swelling.  Right hand pain, swelling, and rotational deformity to ring finger  Neurological: He is alert and oriented to person, place, and time.  Skin: Skin is warm.  Psychiatric: He has a normal mood and affect. His behavior is normal. Judgment and thought content normal.     Assessment/Plan 27 year old male with displaced right ring finger metacarpal fracture have discussed patient's predicament and treatment options.  They want to proceed with open reduction internal fixation of the right ring finger displaced metacarpal fracture as an outpatient.  They understand the risks and benefits and wished to proceed  Marlowe Shores, MD 04/13/2018, 11:17 AM

## 2018-04-13 NOTE — Discharge Instructions (Signed)

## 2018-04-13 NOTE — Anesthesia Preprocedure Evaluation (Addendum)
Anesthesia Evaluation  Patient identified by MRN, date of birth, ID band Patient awake    Reviewed: Allergy & Precautions, NPO status , Patient's Chart, lab work & pertinent test results  History of Anesthesia Complications Negative for: history of anesthetic complications  Airway Mallampati: II  TM Distance: >3 FB Neck ROM: Full    Dental no notable dental hx.    Pulmonary neg pulmonary ROS, Current Smoker,    Pulmonary exam normal        Cardiovascular hypertension, Normal cardiovascular exam     Neuro/Psych negative neurological ROS  negative psych ROS   GI/Hepatic negative GI ROS, Neg liver ROS,   Endo/Other  Morbid obesity  Renal/GU negative Renal ROS  negative genitourinary   Musculoskeletal negative musculoskeletal ROS (+)   Abdominal (+) + obese,   Peds  Hematology negative hematology ROS (+)   Anesthesia Other Findings   Reproductive/Obstetrics                            Anesthesia Physical Anesthesia Plan  ASA: III  Anesthesia Plan: General   Post-op Pain Management:  Regional for Post-op pain   Induction: Intravenous  PONV Risk Score and Plan: 1 and Ondansetron, Dexamethasone, Treatment may vary due to age or medical condition and Midazolam  Airway Management Planned: LMA  Additional Equipment: None  Intra-op Plan:   Post-operative Plan: Extubation in OR  Informed Consent: I have reviewed the patients History and Physical, chart, labs and discussed the procedure including the risks, benefits and alternatives for the proposed anesthesia with the patient or authorized representative who has indicated his/her understanding and acceptance.     Plan Discussed with:   Anesthesia Plan Comments:        Anesthesia Quick Evaluation

## 2018-04-16 ENCOUNTER — Encounter (HOSPITAL_COMMUNITY): Payer: Self-pay | Admitting: Orthopedic Surgery

## 2018-04-16 NOTE — Anesthesia Postprocedure Evaluation (Signed)
Anesthesia Post Note  Patient: Darryl Rice  Procedure(s) Performed: OPEN REDUCTION INTERNAL FIXATION (ORIF) METACARPAL (Right )     Patient location during evaluation: PACU Anesthesia Type: General Level of consciousness: awake Pain management: pain level controlled Vital Signs Assessment: post-procedure vital signs reviewed and stable Respiratory status: spontaneous breathing Cardiovascular status: stable Postop Assessment: no apparent nausea or vomiting Anesthetic complications: no    Last Vitals:  Vitals:   04/13/18 1337 04/13/18 1400  BP: (!) 148/88 137/83  Pulse: 88 85  Resp: 17 18  Temp:    SpO2: 99% 95%    Last Pain:  Vitals:   04/13/18 1430  TempSrc:   PainSc: Asleep   Pain Goal:                 Caren Macadam

## 2019-05-01 ENCOUNTER — Ambulatory Visit (INDEPENDENT_AMBULATORY_CARE_PROVIDER_SITE_OTHER): Payer: Self-pay

## 2019-05-01 ENCOUNTER — Other Ambulatory Visit: Payer: Self-pay

## 2019-05-01 ENCOUNTER — Ambulatory Visit
Admission: EM | Admit: 2019-05-01 | Discharge: 2019-05-01 | Disposition: A | Discharge status: Home / Self Care | Payer: Self-pay | Attending: Emergency Medicine | Admitting: Emergency Medicine

## 2019-05-01 ENCOUNTER — Encounter: Payer: Self-pay | Admitting: Emergency Medicine

## 2019-05-01 DIAGNOSIS — M79641 Pain in right hand: Secondary | ICD-10-CM

## 2019-05-01 DIAGNOSIS — W19XXXA Unspecified fall, initial encounter: Secondary | ICD-10-CM

## 2019-05-01 MED ORDER — MELOXICAM 7.5 MG PO TABS: 7.5000 mg | ORAL_TABLET | Freq: Every day | ORAL | 0 refills | Status: DC

## 2019-05-01 NOTE — Discharge Instructions (Signed)

## 2019-05-01 NOTE — ED Notes (Signed)
Patient able to ambulate independently  

## 2019-05-01 NOTE — ED Provider Notes (Signed)
[facility]    CSN: [id number] Arrival date & time: 05/01/19  1206      History   Chief Complaint Chief Complaint  Patient presents with  . Hand Pain    HPI Darryl Rice is a 28 y.o. male presenting for right hand pain and swelling status post fall yesterday.  States he caught himself with his hands extended in front of him.  Denies head trauma, LOC.  Has not done anything for this, though notes previous fracture in right hand, fourth finger.   Past Medical History:  Diagnosis Date  . Pneumonia     Patient Active Problem List   Diagnosis Date Noted  . ATTENTION DEFICIT, W/HYPERACTIVITY 08/03/2006    Past Surgical History:  Procedure Laterality Date  . NO PAST SURGERIES    . OPEN REDUCTION INTERNAL FIXATION (ORIF) METACARPAL Right 04/13/2018   Procedure: OPEN REDUCTION INTERNAL FIXATION (ORIF) METACARPAL;  Surgeon: Charlotte Crumb, MD;  Location: [facility];  Service: Orthopedics;  Laterality: Right;       Home Medications    Prior to Admission medications   Medication Sig Start Date End Date Taking? Authorizing Provider  meloxicam (MOBIC) 7.5 MG tablet Take 1 tablet (7.5 mg total) by mouth daily. 05/01/19   Hall-Potvin, [place], PA-C    Family History Family History  Problem Relation Age of Onset  . Healthy Mother   . Healthy Father     Social History Social History   Tobacco Use  . Smoking status: Current Every Day Smoker    Packs/day: 0.50    Types: Cigarettes  . Smokeless tobacco: Never Used  Substance Use Topics  . Alcohol use: Yes    Comment: 1 fifth a week  . Drug use: Yes    Types: Marijuana     Allergies   Patient has no known allergies.   Review of Systems Review of Systems  Constitutional: Negative for fatigue and fever.  Respiratory: Negative for cough and shortness of breath.   Cardiovascular: Negative for chest pain and palpitations.  Musculoskeletal:       Positive for right hand pain, swelling   Neurological: Negative for weakness and numbness.     Physical Exam Triage Vital Signs ED Triage Vitals  Enc Vitals Group     BP      Pulse      Resp      Temp      Temp src      SpO2      Weight      Height      Head Circumference      Peak Flow      Pain Score      Pain Loc      Pain Edu?      Excl. in [place]?    No data found.  Updated Vital Signs BP 138/84 (BP Location: Left Arm)   Pulse 76   Temp (!) 97.5 F (36.4 C) (Temporal)   Resp 18   SpO2 96%    Physical Exam Constitutional:      General: He is not in acute distress. HENT:     Head: Normocephalic and atraumatic.  Eyes:     General: No scleral icterus.    Pupils: Pupils are equal, round, and reactive to light.  Cardiovascular:     Rate and Rhythm: Normal rate.  Pulmonary:     Effort: Pulmonary effort is normal.  Musculoskeletal:     Right wrist: He exhibits tenderness and swelling.  He exhibits normal range of motion, no bony tenderness, no effusion, no crepitus, no deformity and no laceration.     Comments: Positive for snuffbox tenderness, decreased strength in right compared to left second pain.  Skin:    General: Skin is warm.     Capillary Refill: Capillary refill takes less than 2 seconds.     Coloration: Skin is not jaundiced or pale.     Findings: Bruising present.  Neurological:     Mental Status: He is alert and oriented to person, place, and time.      UC Treatments / Results  Labs (all labs ordered are listed, but only abnormal results are displayed) Labs Reviewed - No data to display  EKG   Radiology Dg Hand Complete Right  Result Date: 05/01/2019 CLINICAL DATA:  Pain follow fall EXAM: RIGHT HAND - COMPLETE 3+ VIEW COMPARISON:  April 07, 2018. FINDINGS: Frontal, oblique, and lateral views were obtained. There is screw fixation at the site of a prior fracture of the fourth metacarpal. There is mild volar angulation in this area with remodeling. Calcification adjacent to the  ulnar styloid may represent residua of old trauma in this area. There is sclerosis in the third metacarpal which may represent residua of old trauma. There is no appreciable acute fracture or dislocation. There is no appreciable joint space narrowing or erosion. IMPRESSION: Evidence of prior fracture of the fourth metacarpal with screw fixation. There is remodeling in this area with mild volar angulation distally. Calcification adjacent to the ulnar styloid is stable and may represent residua of old trauma as well. Sclerosis noted in the third metacarpal, likely secondary to previous trauma. No acute fracture or dislocation evident. No appreciable joint space narrowing or erosion. Electronically Signed   By: Bretta Bang III M.D.   On: 05/01/2019 12:37    Procedures Procedures (including critical care time)  Medications Ordered in UC Medications - No data to display  Initial Impression / Assessment and Plan / UC Course  I have reviewed the triage vital signs and the nursing notes.  Pertinent labs & imaging results that were available during my care of the patient were reviewed by me and considered in my medical decision making (see chart for details).     Given mechanism of injury, significant swelling, bruising, snuffbox tenderness, imaging was obtained in office, reviewed by me radiology: Negative for acute fracture, dislocation.  Patient has brace at home, though due to swelling is unable to fit this comfortably.  Ace wrap applied in office which patient tolerated well.  Will practice RICE, wear brace moving forward. Return precautions discussed, patient verbalized understanding and is agreeable to plan. Final Clinical Impressions(s) / UC Diagnoses   Final diagnoses:  Right hand pain  Fall, initial encounter     Discharge Instructions     Recommend RICE: rest, ice, compression, elevation as needed for pain.    Heat therapy (hot compress, warm wash red, hot showers, etc.) can help  relax muscles and soothe muscle aches. Cold therapy (ice packs) can be used to help swelling both after injury and after prolonged use of areas of chronic pain/aches.  For pain: recommend 350 mg-1000 mg of Tylenol (acetaminophen) and/or 200 mg - 800 mg of Advil (ibuprofen, Motrin) every 8 hours as needed.  May alternate between the two throughout the day as they are generally safe to take together.  DO NOT exceed more than 3000 mg of Tylenol or 3200 mg of ibuprofen in a 24 hour  period as this could damage your stomach, kidneys, liver, or increase your bleeding risk.    ED Prescriptions    Medication Sig Dispense Auth. Provider   meloxicam (MOBIC) 7.5 MG tablet Take 1 tablet (7.5 mg total) by mouth daily. 15 tablet Hall-Potvin, [place], PA-C     PDMP not reviewed this encounter.   Hall-Potvin, [place], [place]-C 05/01/19 1250

## 2019-05-01 NOTE — ED Triage Notes (Signed)
Pt presents to [facility] after a slip and fall yesterday where he caught himself with his hands Infront of him.  Bruising noted to palm of right hand, broke this same hand last year.

## 2019-11-29 ENCOUNTER — Ambulatory Visit
Admission: EM | Admit: 2019-11-29 | Discharge: 2019-11-29 | Disposition: A | Discharge status: Home / Self Care | Payer: Self-pay | Attending: Physician Assistant | Admitting: Physician Assistant

## 2019-11-29 DIAGNOSIS — Z202 Contact with and (suspected) exposure to infections with a predominantly sexual mode of transmission: Secondary | ICD-10-CM | POA: Insufficient documentation

## 2019-11-29 MED ORDER — DOXYCYCLINE HYCLATE 100 MG PO CAPS: 100.0000 mg | ORAL_CAPSULE | Freq: Two times a day (BID) | ORAL | 0 refills | Status: AC

## 2019-11-29 NOTE — ED Provider Notes (Signed)
[facility]    CSN: [id number] Arrival date & time: 11/29/19  1231      History   Chief Complaint Chief Complaint  Patient presents with  . SEXUALLY TRANSMITTED DISEASE    HPI Darryl Rice is a 29 y.o. male.   29 year old male comes in for STD testing after positive exposure to chlamydia. States partner tested positive. He is asymptomatic. Denies fever, chills, body aches. Denies abdominal pain, nausea, vomiting. Denies urinary symptoms such as frequency, dysuria, hematuria. Denies penile discharge, penile sore/ulcer, testicular swelling, testicular pain. Sexually active with one male partner      Past Medical History:  Diagnosis Date  . Pneumonia     Patient Active Problem List   Diagnosis Date Noted  . ATTENTION DEFICIT, W/HYPERACTIVITY 08/03/2006    Past Surgical History:  Procedure Laterality Date  . NO PAST SURGERIES    . OPEN REDUCTION INTERNAL FIXATION (ORIF) METACARPAL Right 04/13/2018   Procedure: OPEN REDUCTION INTERNAL FIXATION (ORIF) METACARPAL;  Surgeon: Dairl Ponder, MD;  Location: [facility] OR;  Service: Orthopedics;  Laterality: Right;       Home Medications    Prior to Admission medications   Medication Sig Start Date End Date Taking? Authorizing Provider  doxycycline (VIBRAMYCIN) 100 MG capsule Take 1 capsule (100 mg total) by mouth 2 (two) times daily. 11/29/19   Belinda Fisher, PA-C    Family History Family History  Problem Relation Age of Onset  . Healthy Mother   . Healthy Father     Social History Social History   Tobacco Use  . Smoking status: Current Every Day Smoker    Packs/day: 0.50    Types: Cigarettes  . Smokeless tobacco: Never Used  Vaping Use  . Vaping Use: Never used  Substance Use Topics  . Alcohol use: Yes    Comment: 1 fifth a week  . Drug use: Yes    Types: Marijuana     Allergies   Patient has no known allergies.   Review of Systems Review of Systems  Reason unable to perform ROS: See  HPI as above.     Physical Exam Triage Vital Signs ED Triage Vitals  Enc Vitals Group     BP 11/29/19 1342 131/80     Pulse Rate 11/29/19 1342 97     Resp 11/29/19 1342 18     Temp 11/29/19 1342 98.1 F (36.7 C)     Temp Source 11/29/19 1342 Oral     SpO2 11/29/19 1342 94 %     Weight --      Height --      Head Circumference --      Peak Flow --      Pain Score 11/29/19 1303 0     Pain Loc --      Pain Edu? --      Excl. in GC? --    No data found.  Updated Vital Signs BP 131/80 (BP Location: Left Arm)   Pulse 97   Temp 98.1 F (36.7 C) (Oral)   Resp 18   SpO2 94%   Physical Exam Constitutional:      General: He is not in acute distress.    Appearance: Normal appearance. He is well-developed. He is not toxic-appearing or diaphoretic.  HENT:     Head: Normocephalic and atraumatic.  Eyes:     Conjunctiva/sclera: Conjunctivae normal.     Pupils: Pupils are equal, round, and reactive to light.  Pulmonary:  Effort: Pulmonary effort is normal. No respiratory distress.     Comments: Speaking in full sentences without difficulty Musculoskeletal:     Cervical back: Normal range of motion and neck supple.  Skin:    General: Skin is warm and dry.  Neurological:     Mental Status: He is alert and oriented to person, place, and time.     UC Treatments / Results  Labs (all labs ordered are listed, but only abnormal results are displayed) Labs Reviewed  CYTOLOGY, (ORAL, ANAL, URETHRAL) ANCILLARY ONLY    EKG   Radiology No results found.  Procedures Procedures (including critical care time)  Medications Ordered in UC Medications - No data to display  Initial Impression / Assessment and Plan / UC Course  I have reviewed the triage vital signs and the nursing notes.  Pertinent labs & imaging results that were available during my care of the patient were reviewed by me and considered in my medical decision making (see chart for details).    Patient was  treated empirically for chlamydia. Start doxycycline as directed. Cytology sent, patient will be contacted with any positive results that require additional treatment. Patient to refrain from sexual activity for the next 7 days. Return precautions given.   Final Clinical Impressions(s) / UC Diagnoses   Final diagnoses:  Exposure to STD   ED Prescriptions    Medication Sig Dispense Auth. Provider   doxycycline (VIBRAMYCIN) 100 MG capsule Take 1 capsule (100 mg total) by mouth 2 (two) times daily. 14 capsule Ok Edwards, PA-C     PDMP not reviewed this encounter.   Ok Edwards, PA-C 11/29/19 1344

## 2019-11-29 NOTE — Discharge Instructions (Signed)
You were treated empirically for chlamydia. Start doxycycline as directed. Cytology sent, you will be contacted with any positive results that requires further treatment. Refrain from sexual activity and alcohol use for the next 7 days. If developing testicular swelling/pain, penile lesion/sore, follow up for reevaluation.   

## 2019-11-29 NOTE — ED Triage Notes (Signed)
Pt states received a call one of his partners is positive for chlamydia. Denies s/sx's. States last un protective sexual intercourse with her was 3wks ago.

## 2019-12-02 ENCOUNTER — Telehealth: Payer: Self-pay | Admitting: Emergency Medicine

## 2019-12-02 LAB — CYTOLOGY, (ORAL, ANAL, URETHRAL) ANCILLARY ONLY
Chlamydia: NEGATIVE
Comment: NEGATIVE
Comment: NEGATIVE
Comment: NORMAL
Neisseria Gonorrhea: NEGATIVE
Trichomonas: NEGATIVE

## 2019-12-02 NOTE — Telephone Encounter (Signed)
Returned patient's phone call for lab results from recent visit.  Verified identity using two identifiers and provided negative results.  Patient verbalized understanding.
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
## 2021-01-16 IMAGING — DX DG HAND COMPLETE 3+V*R*
3 series · 3 of 3 positions shown · non-contrast
Comparison: April 07, 2018.

CLINICAL DATA: Pain follow fall

EXAM:
RIGHT HAND - COMPLETE 3+ VIEW

[hand pa]
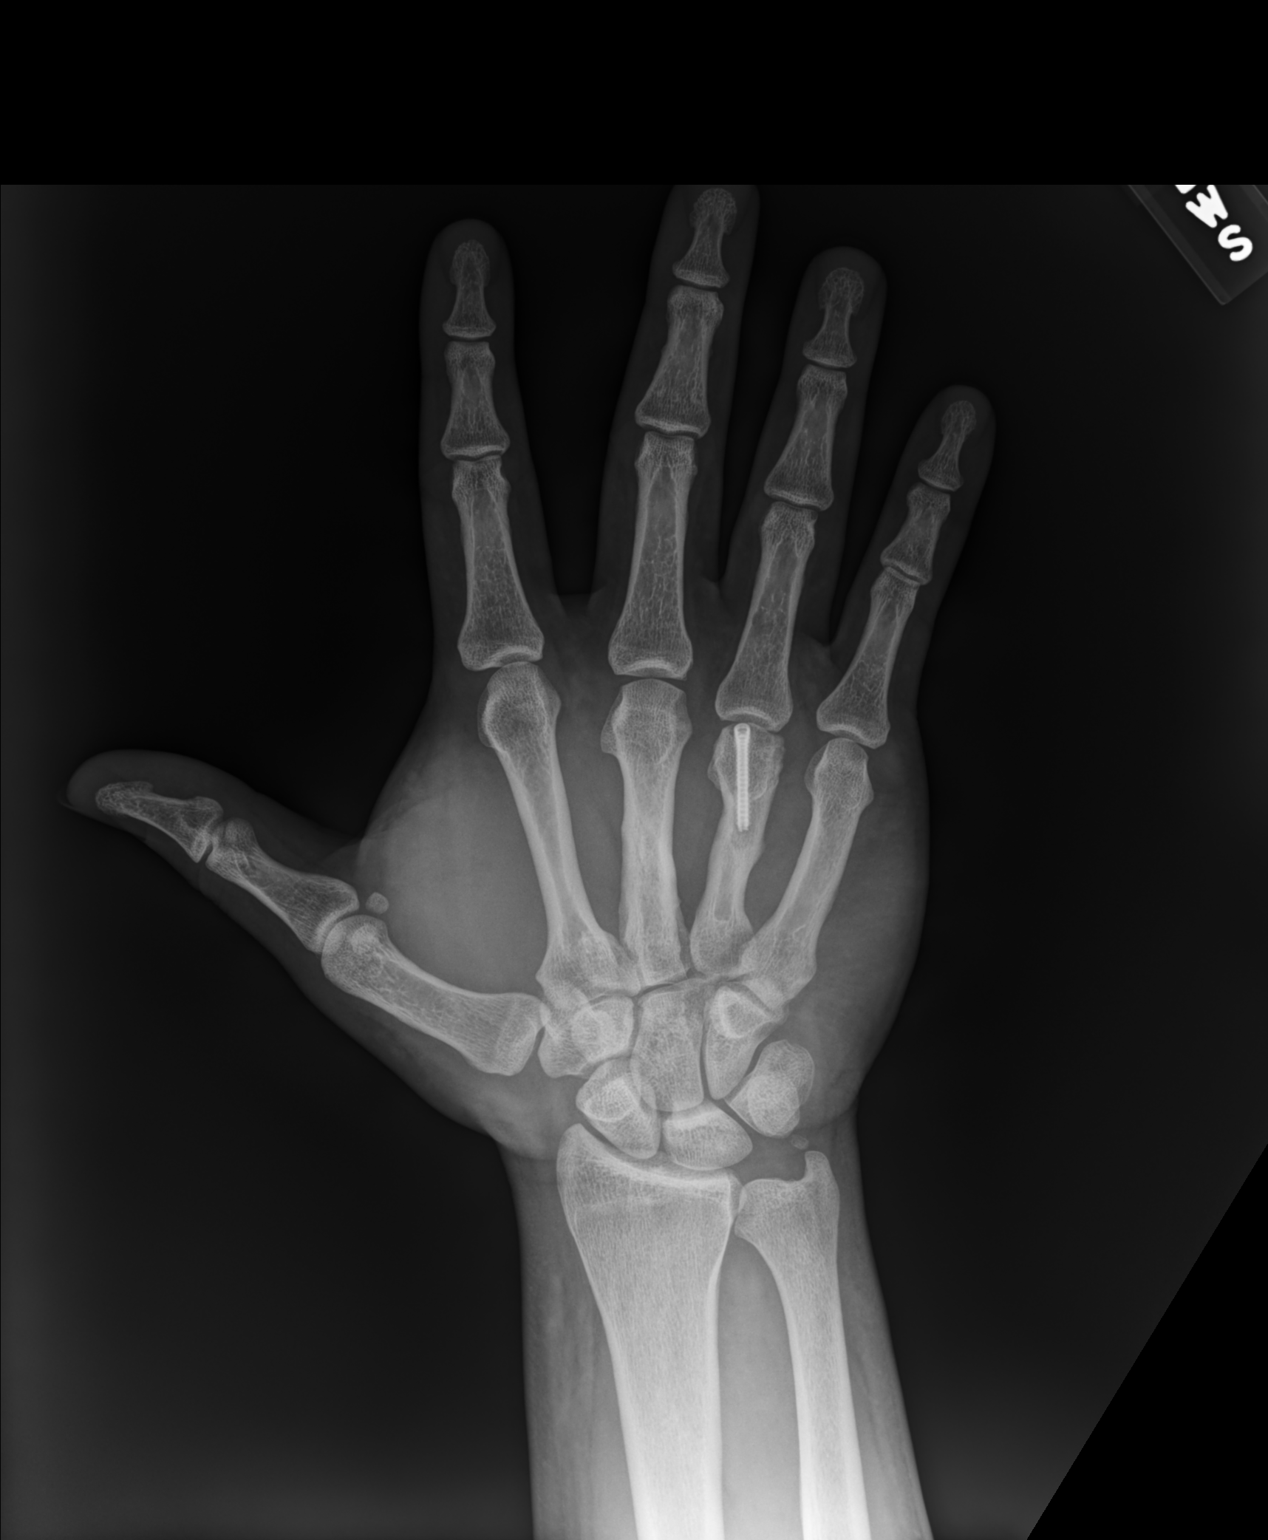

[hand mlo]
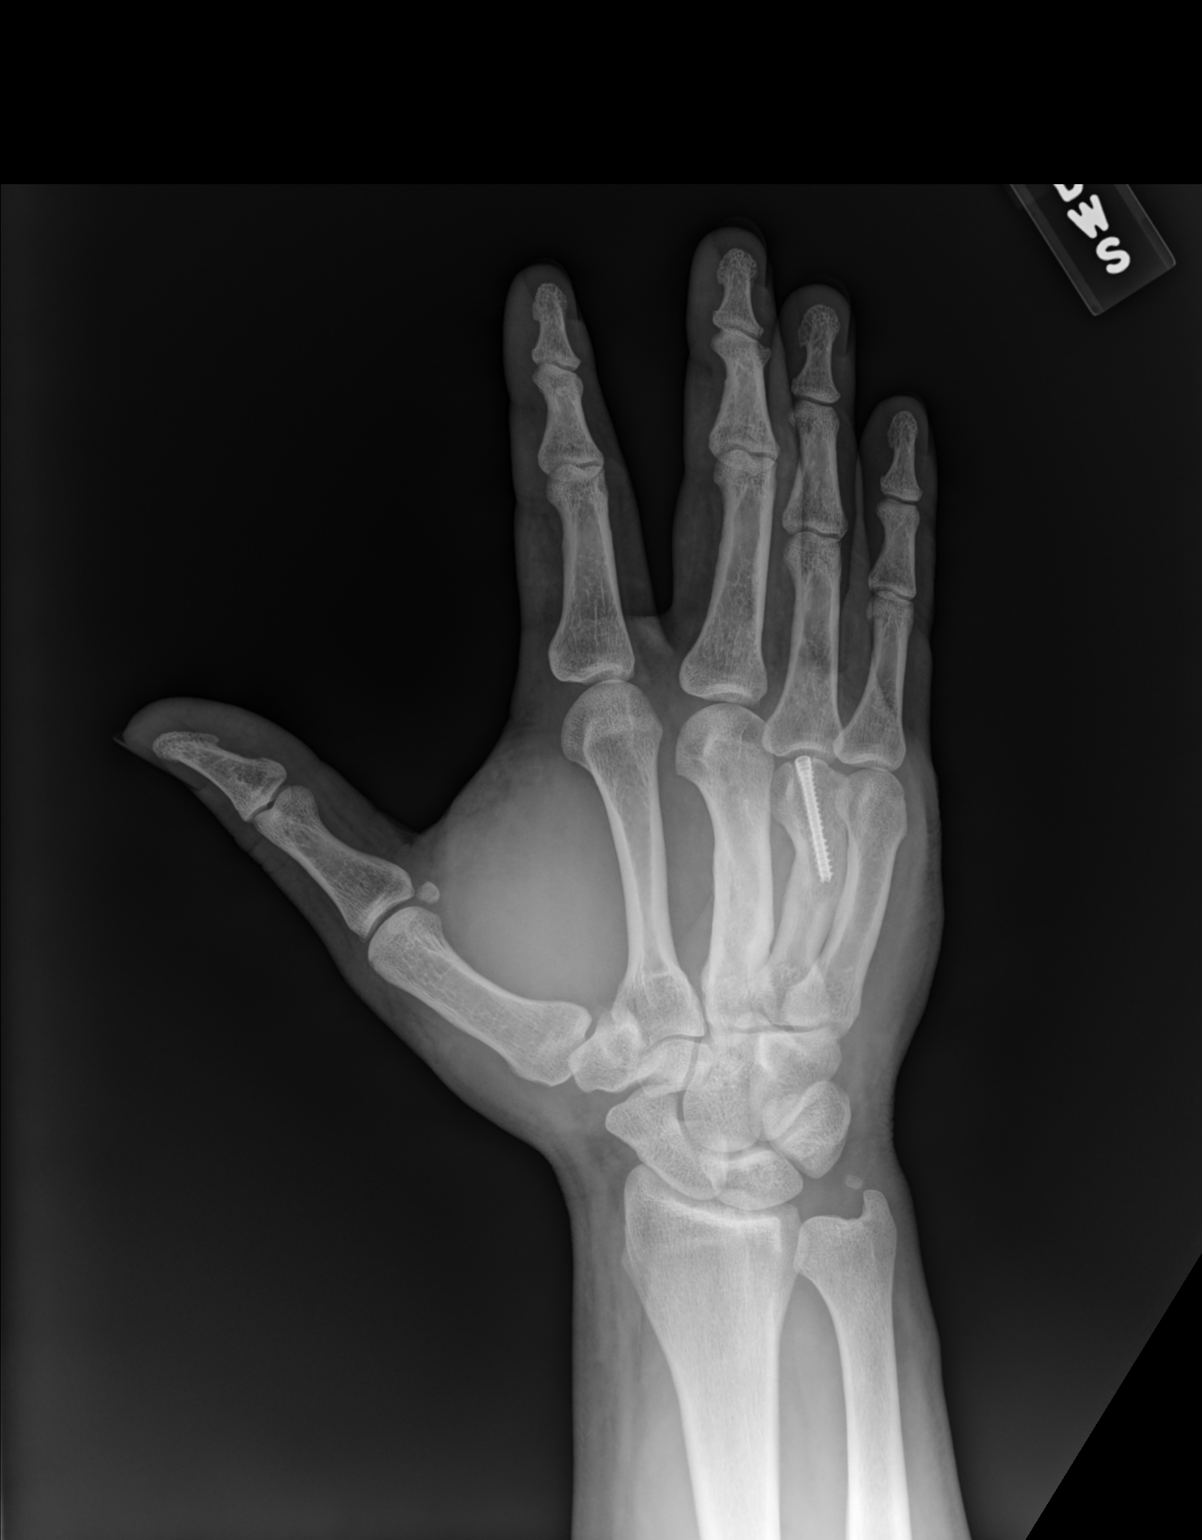

[hand lat]
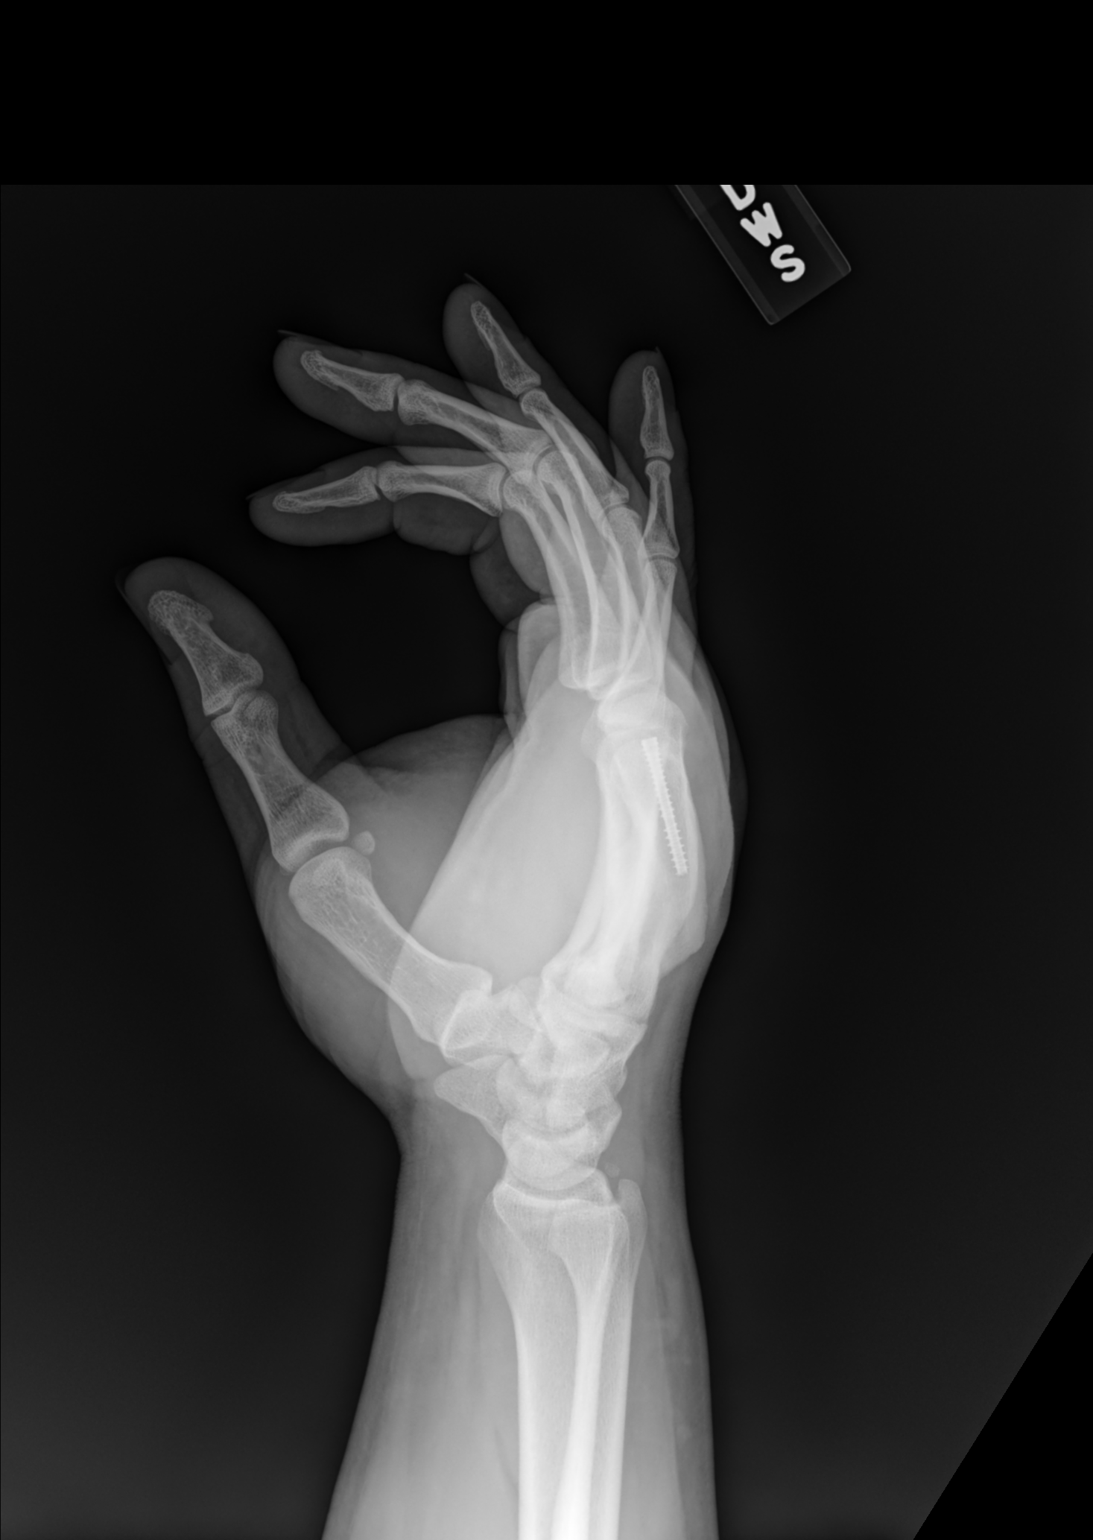

[3 of 3 positions shown; findings below may reference images not displayed]

FINDINGS: Frontal, oblique, and lateral views were obtained. There is screw
fixation at the site of a prior fracture of the fourth metacarpal.
There is mild volar angulation in this area with remodeling.
Calcification adjacent to the ulnar styloid may represent residua of
old trauma in this area. There is sclerosis in the third metacarpal
which may represent residua of old trauma. There is no appreciable
acute fracture or dislocation. There is no appreciable joint space
narrowing or erosion.
IMPRESSION: Evidence of prior fracture of the fourth metacarpal with screw
fixation. There is remodeling in this area with mild volar
angulation distally. Calcification adjacent to the ulnar styloid is
stable and may represent residua of old trauma as well. Sclerosis
noted in the third metacarpal, likely secondary to previous trauma.
No acute fracture or dislocation evident. No appreciable joint space
narrowing or erosion.

## 2023-02-08 DIAGNOSIS — K921 Melena: Secondary | ICD-10-CM | POA: Diagnosis not present

## 2023-02-08 DIAGNOSIS — Z0000 Encounter for general adult medical examination without abnormal findings: Secondary | ICD-10-CM | POA: Diagnosis not present

## 2023-02-08 DIAGNOSIS — M25562 Pain in left knee: Secondary | ICD-10-CM | POA: Diagnosis not present

## 2023-02-08 DIAGNOSIS — Z113 Encounter for screening for infections with a predominantly sexual mode of transmission: Secondary | ICD-10-CM | POA: Diagnosis not present

## 2023-02-08 DIAGNOSIS — Z833 Family history of diabetes mellitus: Secondary | ICD-10-CM | POA: Diagnosis not present

## 2023-02-08 DIAGNOSIS — I10 Essential (primary) hypertension: Secondary | ICD-10-CM | POA: Diagnosis not present

## 2023-03-22 DIAGNOSIS — K921 Melena: Secondary | ICD-10-CM | POA: Diagnosis not present

## 2023-03-22 DIAGNOSIS — R7401 Elevation of levels of liver transaminase levels: Secondary | ICD-10-CM | POA: Diagnosis not present

## 2023-03-30 DIAGNOSIS — Z1211 Encounter for screening for malignant neoplasm of colon: Secondary | ICD-10-CM | POA: Diagnosis not present

## 2023-03-30 DIAGNOSIS — K921 Melena: Secondary | ICD-10-CM | POA: Diagnosis not present

## 2023-04-15 DIAGNOSIS — K635 Polyp of colon: Secondary | ICD-10-CM | POA: Diagnosis not present

## 2023-04-26 ENCOUNTER — Emergency Department (HOSPITAL_COMMUNITY)
Admission: EM | Admit: 2023-04-26 | Discharge: 2023-04-27 | Discharge status: Against Medical Advice | Payer: Medicaid Other | Attending: Emergency Medicine | Admitting: Emergency Medicine

## 2023-04-26 ENCOUNTER — Other Ambulatory Visit: Payer: Self-pay

## 2023-04-26 DIAGNOSIS — F17210 Nicotine dependence, cigarettes, uncomplicated: Secondary | ICD-10-CM | POA: Insufficient documentation

## 2023-04-26 DIAGNOSIS — Z23 Encounter for immunization: Secondary | ICD-10-CM | POA: Diagnosis not present

## 2023-04-26 DIAGNOSIS — Z5321 Procedure and treatment not carried out due to patient leaving prior to being seen by health care provider: Secondary | ICD-10-CM | POA: Insufficient documentation

## 2023-04-26 DIAGNOSIS — S3135XA Open bite of scrotum and testes, initial encounter: Secondary | ICD-10-CM | POA: Diagnosis not present

## 2023-04-26 DIAGNOSIS — S3131XA Laceration without foreign body of scrotum and testes, initial encounter: Secondary | ICD-10-CM | POA: Diagnosis not present

## 2023-04-26 NOTE — ED Notes (Signed)
Pt stated if it takes too long to get a room he will leave
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
## 2023-04-27 ENCOUNTER — Emergency Department (HOSPITAL_COMMUNITY)
Admission: EM | Admit: 2023-04-27 | Discharge: 2023-04-27 | Disposition: A | Discharge status: Home / Self Care | Payer: Medicaid Other | Source: Home / Self Care | Attending: Emergency Medicine | Admitting: Emergency Medicine

## 2023-04-27 ENCOUNTER — Emergency Department (HOSPITAL_COMMUNITY): Payer: Medicaid Other

## 2023-04-27 ENCOUNTER — Other Ambulatory Visit: Payer: Self-pay

## 2023-04-27 ENCOUNTER — Encounter (HOSPITAL_COMMUNITY): Payer: Self-pay

## 2023-04-27 DIAGNOSIS — S3131XA Laceration without foreign body of scrotum and testes, initial encounter: Secondary | ICD-10-CM | POA: Diagnosis not present

## 2023-04-27 DIAGNOSIS — S3135XA Open bite of scrotum and testes, initial encounter: Secondary | ICD-10-CM | POA: Diagnosis not present

## 2023-04-27 DIAGNOSIS — W503XXA Accidental bite by another person, initial encounter: Secondary | ICD-10-CM

## 2023-04-27 DIAGNOSIS — F17210 Nicotine dependence, cigarettes, uncomplicated: Secondary | ICD-10-CM | POA: Insufficient documentation

## 2023-04-27 DIAGNOSIS — Z23 Encounter for immunization: Secondary | ICD-10-CM | POA: Insufficient documentation

## 2023-04-27 MED ORDER — TETANUS-DIPHTH-ACELL PERTUSSIS 5-2.5-18.5 LF-MCG/0.5 IM SUSY
0.5000 mL | PREFILLED_SYRINGE | Freq: Once | INTRAMUSCULAR | Status: AC
  Administered 2023-04-27: 0.5 mL via INTRAMUSCULAR
  Filled 2023-04-27: qty 0.5

## 2023-04-27 MED ORDER — CLINDAMYCIN HCL 150 MG PO CAPS: 150.0000 mg | ORAL_CAPSULE | Freq: Four times a day (QID) | ORAL | 0 refills | Status: AC

## 2023-04-27 MED ORDER — SULFAMETHOXAZOLE-TRIMETHOPRIM 800-160 MG PO TABS: 1.0000 | ORAL_TABLET | Freq: Two times a day (BID) | ORAL | Status: DC

## 2023-04-27 MED ORDER — CEFAZOLIN SODIUM-DEXTROSE 1-4 GM/50ML-% IV SOLN
1.0000 g | Freq: Once | INTRAVENOUS | Status: AC
  Administered 2023-04-27: 1 g via INTRAVENOUS
  Filled 2023-04-27: qty 50

## 2023-04-27 MED ORDER — OXYCODONE-ACETAMINOPHEN 5-325 MG PO TABS: 1.0000 | ORAL_TABLET | Freq: Four times a day (QID) | ORAL | 0 refills | Status: AC | PRN

## 2023-04-27 MED ORDER — OXYCODONE-ACETAMINOPHEN 5-325 MG PO TABS
1.0000 | ORAL_TABLET | ORAL | Status: AC | PRN
  Administered 2023-04-27 (×2): 1 via ORAL
  Filled 2023-04-27 (×2): qty 1

## 2023-04-27 MED ORDER — BACITRACIN ZINC 500 UNIT/GM EX OINT
TOPICAL_OINTMENT | Freq: Two times a day (BID) | CUTANEOUS | Status: DC
  Administered 2023-04-27: 1 via TOPICAL
  Filled 2023-04-27: qty 0.9
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
## 2023-04-27 NOTE — ED Notes (Signed)
Pt left. 
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
## 2023-04-27 NOTE — ED Notes (Signed)
Ice water given to pt °

## 2023-04-27 NOTE — Discharge Instructions (Addendum)
Please follow-up with your urologist in regards recent ER visit.  Today your ultrasound was reassuring but I have given you a few days worth of antibiotics and pain meds that you may take for your symptoms.  You may take Tylenol or ibuprofen every 6 hours needed for pain however if this pain is not controlled you may use the Percocet I prescribed.  This antibiotic does have a high risk of diarrhea and so if this does happen an please come back to the ER to be evaluated.  If symptoms change or worsen please return to ER.
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
## 2023-06-21 DIAGNOSIS — K648 Other hemorrhoids: Secondary | ICD-10-CM | POA: Diagnosis not present

## 2023-06-21 DIAGNOSIS — R7401 Elevation of levels of liver transaminase levels: Secondary | ICD-10-CM | POA: Diagnosis not present

## 2023-06-21 DIAGNOSIS — Z1211 Encounter for screening for malignant neoplasm of colon: Secondary | ICD-10-CM | POA: Diagnosis not present

## 2023-06-22 DIAGNOSIS — E782 Mixed hyperlipidemia: Secondary | ICD-10-CM | POA: Diagnosis not present

## 2023-06-22 DIAGNOSIS — I10 Essential (primary) hypertension: Secondary | ICD-10-CM | POA: Diagnosis not present

## 2023-06-22 DIAGNOSIS — R109 Unspecified abdominal pain: Secondary | ICD-10-CM | POA: Diagnosis not present

## 2023-06-22 DIAGNOSIS — R0683 Snoring: Secondary | ICD-10-CM | POA: Diagnosis not present

## 2023-07-04 DIAGNOSIS — K648 Other hemorrhoids: Secondary | ICD-10-CM | POA: Diagnosis not present

## 2023-07-04 DIAGNOSIS — Z1211 Encounter for screening for malignant neoplasm of colon: Secondary | ICD-10-CM | POA: Diagnosis not present

## 2023-09-11 DIAGNOSIS — R0683 Snoring: Secondary | ICD-10-CM | POA: Diagnosis not present

## 2023-09-11 DIAGNOSIS — G4719 Other hypersomnia: Secondary | ICD-10-CM | POA: Diagnosis not present

## 2023-11-16 DIAGNOSIS — G4719 Other hypersomnia: Secondary | ICD-10-CM | POA: Diagnosis not present

## 2023-11-16 DIAGNOSIS — R0683 Snoring: Secondary | ICD-10-CM | POA: Diagnosis not present

## 2023-11-16 DIAGNOSIS — G4733 Obstructive sleep apnea (adult) (pediatric): Secondary | ICD-10-CM | POA: Diagnosis not present

## 2023-12-20 DIAGNOSIS — I10 Essential (primary) hypertension: Secondary | ICD-10-CM | POA: Diagnosis not present

## 2023-12-20 DIAGNOSIS — E782 Mixed hyperlipidemia: Secondary | ICD-10-CM | POA: Diagnosis not present

## 2023-12-20 DIAGNOSIS — R7303 Prediabetes: Secondary | ICD-10-CM | POA: Diagnosis not present

## 2023-12-25 DIAGNOSIS — K760 Fatty (change of) liver, not elsewhere classified: Secondary | ICD-10-CM | POA: Diagnosis not present

## 2023-12-25 DIAGNOSIS — K648 Other hemorrhoids: Secondary | ICD-10-CM | POA: Diagnosis not present

## 2023-12-25 DIAGNOSIS — R7401 Elevation of levels of liver transaminase levels: Secondary | ICD-10-CM | POA: Diagnosis not present

## 2023-12-25 DIAGNOSIS — K709 Alcoholic liver disease, unspecified: Secondary | ICD-10-CM | POA: Diagnosis not present

## 2024-02-29 DIAGNOSIS — E782 Mixed hyperlipidemia: Secondary | ICD-10-CM | POA: Diagnosis not present

## 2024-02-29 DIAGNOSIS — Z113 Encounter for screening for infections with a predominantly sexual mode of transmission: Secondary | ICD-10-CM | POA: Diagnosis not present

## 2024-02-29 DIAGNOSIS — Z7689 Persons encountering health services in other specified circumstances: Secondary | ICD-10-CM | POA: Diagnosis not present

## 2024-02-29 DIAGNOSIS — I10 Essential (primary) hypertension: Secondary | ICD-10-CM | POA: Diagnosis not present

## 2024-02-29 DIAGNOSIS — R799 Abnormal finding of blood chemistry, unspecified: Secondary | ICD-10-CM | POA: Diagnosis not present

## 2024-03-21 DIAGNOSIS — Z7689 Persons encountering health services in other specified circumstances: Secondary | ICD-10-CM | POA: Diagnosis not present

## 2024-03-21 DIAGNOSIS — Z23 Encounter for immunization: Secondary | ICD-10-CM | POA: Diagnosis not present

## 2024-03-21 DIAGNOSIS — R7303 Prediabetes: Secondary | ICD-10-CM | POA: Diagnosis not present

## 2024-03-21 DIAGNOSIS — I10 Essential (primary) hypertension: Secondary | ICD-10-CM | POA: Diagnosis not present

## 2024-07-05 ENCOUNTER — Emergency Department (HOSPITAL_BASED_OUTPATIENT_CLINIC_OR_DEPARTMENT_OTHER)
Admission: EM | Admit: 2024-07-05 | Discharge: 2024-07-06 | Disposition: A | Attending: Emergency Medicine | Admitting: Emergency Medicine

## 2024-07-05 ENCOUNTER — Emergency Department (HOSPITAL_BASED_OUTPATIENT_CLINIC_OR_DEPARTMENT_OTHER)

## 2024-07-05 ENCOUNTER — Encounter (HOSPITAL_BASED_OUTPATIENT_CLINIC_OR_DEPARTMENT_OTHER): Payer: Self-pay | Admitting: Emergency Medicine

## 2024-07-05 DIAGNOSIS — K5732 Diverticulitis of large intestine without perforation or abscess without bleeding: Secondary | ICD-10-CM | POA: Insufficient documentation

## 2024-07-05 DIAGNOSIS — K5792 Diverticulitis of intestine, part unspecified, without perforation or abscess without bleeding: Secondary | ICD-10-CM

## 2024-07-05 DIAGNOSIS — D72829 Elevated white blood cell count, unspecified: Secondary | ICD-10-CM | POA: Diagnosis not present

## 2024-07-05 DIAGNOSIS — E871 Hypo-osmolality and hyponatremia: Secondary | ICD-10-CM | POA: Diagnosis not present

## 2024-07-05 DIAGNOSIS — R000 Tachycardia, unspecified: Secondary | ICD-10-CM | POA: Insufficient documentation

## 2024-07-05 DIAGNOSIS — R1032 Left lower quadrant pain: Secondary | ICD-10-CM | POA: Diagnosis present

## 2024-07-05 LAB — CBC
HCT: 41.5 % (ref 39.0–52.0)
Hemoglobin: 13.7 g/dL (ref 13.0–17.0)
MCH: 25 pg — ABNORMAL LOW (ref 26.0–34.0)
MCHC: 33 g/dL (ref 30.0–36.0)
MCV: 75.9 fL — ABNORMAL LOW (ref 80.0–100.0)
Platelets: 202 10*3/uL (ref 150–400)
RBC: 5.47 MIL/uL (ref 4.22–5.81)
RDW: 13.4 % (ref 11.5–15.5)
WBC: 15.8 10*3/uL — ABNORMAL HIGH (ref 4.0–10.5)
nRBC: 0 % (ref 0.0–0.2)

## 2024-07-05 LAB — URINALYSIS, ROUTINE W REFLEX MICROSCOPIC
Bilirubin Urine: NEGATIVE
Glucose, UA: NEGATIVE mg/dL
Hgb urine dipstick: NEGATIVE
Ketones, ur: NEGATIVE mg/dL
Leukocytes,Ua: NEGATIVE
Nitrite: NEGATIVE
Specific Gravity, Urine: 1.014 (ref 1.005–1.030)
pH: 7 (ref 5.0–8.0)

## 2024-07-05 LAB — LACTIC ACID, PLASMA
Lactic Acid, Venous: 1.9 mmol/L (ref 0.5–1.9)
Lactic Acid, Venous: 1.9 mmol/L (ref 0.5–1.9)

## 2024-07-05 LAB — COMPREHENSIVE METABOLIC PANEL WITH GFR
ALT: 50 U/L — ABNORMAL HIGH (ref 0–44)
AST: 18 U/L (ref 15–41)
Albumin: 4.5 g/dL (ref 3.5–5.0)
Alkaline Phosphatase: 124 U/L (ref 38–126)
Anion gap: 15 (ref 5–15)
BUN: 13 mg/dL (ref 6–20)
CO2: 24 mmol/L (ref 22–32)
Calcium: 9.7 mg/dL (ref 8.9–10.3)
Chloride: 95 mmol/L — ABNORMAL LOW (ref 98–111)
Creatinine, Ser: 1.44 mg/dL — ABNORMAL HIGH (ref 0.61–1.24)
GFR, Estimated: >60 mL/min
Glucose, Bld: 108 mg/dL — ABNORMAL HIGH (ref 70–99)
Potassium: 3.6 mmol/L (ref 3.5–5.1)
Sodium: 133 mmol/L — ABNORMAL LOW (ref 135–145)
Total Bilirubin: 1.2 mg/dL (ref 0.0–1.2)
Total Protein: 8.2 g/dL — ABNORMAL HIGH (ref 6.5–8.1)

## 2024-07-05 LAB — LIPASE, BLOOD: Lipase: 17 U/L (ref 11–51)

## 2024-07-05 MED ORDER — SODIUM CHLORIDE 0.9 % IV BOLUS
1000.0000 mL | Freq: Once | INTRAVENOUS | Status: AC
  Administered 2024-07-05: 1000 mL via INTRAVENOUS

## 2024-07-05 MED ORDER — LACTATED RINGERS IV BOLUS: 1000.0000 mL | Freq: Once | INTRAVENOUS | Status: DC

## 2024-07-05 MED ORDER — IOHEXOL 300 MG/ML  SOLN
125.0000 mL | Freq: Once | INTRAMUSCULAR | Status: AC | PRN
  Administered 2024-07-05: 125 mL via INTRAVENOUS

## 2024-07-05 MED ORDER — ONDANSETRON HCL 4 MG/2ML IJ SOLN
4.0000 mg | Freq: Once | INTRAMUSCULAR | Status: AC
  Administered 2024-07-05: 4 mg via INTRAVENOUS
  Filled 2024-07-05: qty 2

## 2024-07-05 MED ORDER — FENTANYL CITRATE (PF) 50 MCG/ML IJ SOSY
50.0000 ug | PREFILLED_SYRINGE | Freq: Once | INTRAMUSCULAR | Status: AC
  Administered 2024-07-05: 50 ug via INTRAVENOUS
  Filled 2024-07-05: qty 1

## 2024-07-05 NOTE — ED Provider Notes (Signed)
" °  [place] EMERGENCY DEPARTMENT AT [facility] Provider Note   CSN: [id number] Arrival date & time: 07/05/24  1857     Patient presents with: Abdominal Pain   Darryl Rice is a 34 y.o. male.  {Add pertinent medical, surgical, social history, OB history to HPI:32947}  Abdominal Pain      Prior to Admission medications  Medication Sig Start Date End Date Taking? Authorizing Provider  clindamycin  (CLEOCIN ) 150 MG capsule Take 1 capsule (150 mg total) by mouth every 6 (six) hours. 04/27/23   Victor Lynwood DASEN, PA-C  doxycycline  (VIBRAMYCIN ) 100 MG capsule Take 1 capsule (100 mg total) by mouth 2 (two) times daily. 11/29/19   Babara Greig GAILS, PA-C    Allergies: Patient has no known allergies.    Review of Systems  Gastrointestinal:  Positive for abdominal pain.    Updated Vital Signs BP 131/79   Pulse 95   Temp 98.6 F (37 C)   Resp 17   SpO2 95%   Physical Exam  (all labs ordered are listed, but only abnormal results are displayed) Labs Reviewed  COMPREHENSIVE METABOLIC PANEL WITH GFR - Abnormal; Notable for the following components:      Result Value   Sodium 133 (*)    Chloride 95 (*)    Glucose, Bld 108 (*)    Creatinine, Ser 1.44 (*)    Total Protein 8.2 (*)    ALT 50 (*)    All other components within normal limits  CBC - Abnormal; Notable for the following components:   WBC 15.8 (*)    MCV 75.9 (*)    MCH 25.0 (*)    All other components within normal limits  URINALYSIS, ROUTINE W REFLEX MICROSCOPIC - Abnormal; Notable for the following components:   Protein, ur TRACE (*)    All other components within normal limits  CULTURE, BLOOD (ROUTINE X 2)  CULTURE, BLOOD (ROUTINE X 2)  LIPASE, BLOOD  LACTIC ACID, PLASMA  LACTIC ACID, PLASMA    EKG: None  Radiology: No results found.  {Document cardiac monitor, telemetry assessment procedure when appropriate:32947} Procedures   Medications Ordered in the ED  fentaNYL  (SUBLIMAZE ) injection 50  mcg (50 mcg Intravenous Given 07/05/24 2113)  ondansetron  (ZOFRAN ) injection 4 mg (4 mg Intravenous Given 07/05/24 2113)  sodium chloride  0.9 % bolus 1,000 mL (0 mLs Intravenous Stopped 07/05/24 2229)  iohexol  (OMNIPAQUE ) 300 MG/ML solution 125 mL (125 mLs Intravenous Contrast Given 07/05/24 2214)      {Click here for ABCD2, HEART and other calculators REFRESH Note before signing:1}                              Medical Decision Making Amount and/or Complexity of Data Reviewed Labs: ordered. Radiology: ordered.  Risk Prescription drug management.   ***  {Document critical care time when appropriate  Document review of labs and clinical decision tools ie CHADS2VASC2, etc  Document your independent review of radiology images and any outside records  Document your discussion with family members, caretakers and with consultants  Document social determinants of health affecting pt's care  Document your decision making why or why not admission, treatments were needed:32947:::1}   Final diagnoses:  None    ED Discharge Orders     None        "

## 2024-07-05 NOTE — ED Notes (Signed)
 Patient transported to CT

## 2024-07-05 NOTE — ED Triage Notes (Signed)
 Left side abdo pain Started 3 AM  Unrelieved with home meds, gas x and dulcolax   I've had an appendix issue I think its fixin' to rupture

## 2024-07-06 MED ORDER — ACETAMINOPHEN 500 MG PO TABS
1000.0000 mg | ORAL_TABLET | Freq: Once | ORAL | Status: AC
  Administered 2024-07-06: 1000 mg via ORAL
  Filled 2024-07-06: qty 2

## 2024-07-06 MED ORDER — ONDANSETRON 4 MG PO TBDP: 4.0000 mg | ORAL_TABLET | Freq: Three times a day (TID) | ORAL | 0 refills | Status: AC | PRN

## 2024-07-06 MED ORDER — AMOXICILLIN-POT CLAVULANATE 875-125 MG PO TABS
1.0000 | ORAL_TABLET | Freq: Once | ORAL | Status: AC
  Administered 2024-07-06: 1 via ORAL
  Filled 2024-07-06: qty 1

## 2024-07-06 MED ORDER — KETOROLAC TROMETHAMINE 15 MG/ML IJ SOLN
15.0000 mg | Freq: Once | INTRAMUSCULAR | Status: AC
  Administered 2024-07-06: 15 mg via INTRAVENOUS
  Filled 2024-07-06: qty 1

## 2024-07-06 NOTE — Discharge Instructions (Addendum)
 You are found today to have inflammation of your intestine called diverticulitis.  This is what is causing your abdominal pain.  At home, please continue on a clear liquid diet for the next 2 to 3 days with food such as ginger ale, Gatorade, broth.  You may then advance your diet as your abdominal pain starts to improve with foods like crackers, applesauce.  Please follow-up with your PCP within the next week for a recheck of your symptoms.  It is recommended that patients with diverticulitis obtain an outpatient colonoscopy within the next 6-8 weeks to rule out underlying problems in the colon that could be causing this infection.  Please discuss this with your PCP who can help arrange this for you.  You may take up to 1000mg  of tylenol  every 6 hours as needed for pain.  Do not take more then 4g per day.  We gave you dose today, next dose can be no sooner than 6:30 AM  You may use up to 600mg  ibuprofen  every 6 hours as needed for pain.  Do not exceed 2.4g of ibuprofen  per day. We gave you a dose today, next dose can be no sooner than 6:30am  You have been prescribed Zofran  (ondansetron ) for nausea and vomiting. You may take this every 8 hours as needed for nausea and vomiting. This medication dissolves under the tongue. You do not need to swallow it.  Please continue taking your Augmentin  as prescribed twice daily for the remainder of the antibiotic course.  Please return to the ER if you develop worsening abdominal pain, fevers, uncontrolled vomiting, any other new or concerning symptoms

## 2024-07-11 LAB — CULTURE, BLOOD (ROUTINE X 2)
Culture: NO GROWTH
Culture: NO GROWTH
Special Requests: ADEQUATE
Special Requests: ADEQUATE
